# Patient Record
Sex: Female | Born: 1945 | Race: Black or African American | Hispanic: No | Smoking: Current every day smoker
Health system: Southern US, Community
[De-identification: ages and names within clinical notes are randomized; demographics above are authoritative.]

## PROBLEM LIST (undated history)

## (undated) DIAGNOSIS — M1990 Unspecified osteoarthritis, unspecified site: Secondary | ICD-10-CM

## (undated) DIAGNOSIS — M5136 Other intervertebral disc degeneration, lumbar region: Secondary | ICD-10-CM

## (undated) DIAGNOSIS — M51369 Other intervertebral disc degeneration, lumbar region without mention of lumbar back pain or lower extremity pain: Secondary | ICD-10-CM

## (undated) DIAGNOSIS — M8580 Other specified disorders of bone density and structure, unspecified site: Secondary | ICD-10-CM

## (undated) DIAGNOSIS — M869 Osteomyelitis, unspecified: Secondary | ICD-10-CM

## (undated) DIAGNOSIS — K219 Gastro-esophageal reflux disease without esophagitis: Secondary | ICD-10-CM

## (undated) DIAGNOSIS — E7800 Pure hypercholesterolemia, unspecified: Secondary | ICD-10-CM

## (undated) DIAGNOSIS — I10 Essential (primary) hypertension: Secondary | ICD-10-CM

## (undated) DIAGNOSIS — I639 Cerebral infarction, unspecified: Secondary | ICD-10-CM

## (undated) DIAGNOSIS — R7302 Impaired glucose tolerance (oral): Secondary | ICD-10-CM

## (undated) HISTORY — PX: COLONOSCOPY WITH ESOPHAGOGASTRODUODENOSCOPY (EGD): SHX5779

## (undated) HISTORY — PX: OTHER SURGICAL HISTORY: SHX169

## (undated) HISTORY — PX: TUBAL LIGATION: SHX77

## (undated) HISTORY — PX: APPENDECTOMY: SHX54

---

## 2005-05-02 ENCOUNTER — Ambulatory Visit: Payer: Self-pay | Admitting: Internal Medicine

## 2005-05-13 ENCOUNTER — Ambulatory Visit: Payer: Self-pay | Admitting: Internal Medicine

## 2005-11-18 ENCOUNTER — Ambulatory Visit: Payer: Self-pay | Admitting: Internal Medicine

## 2006-05-01 ENCOUNTER — Ambulatory Visit: Payer: Self-pay | Admitting: Internal Medicine

## 2006-07-14 ENCOUNTER — Ambulatory Visit: Payer: Self-pay | Admitting: Unknown Physician Specialty

## 2007-06-18 ENCOUNTER — Ambulatory Visit: Payer: Self-pay | Admitting: Nurse Practitioner

## 2008-06-23 ENCOUNTER — Ambulatory Visit: Payer: Self-pay | Admitting: Internal Medicine

## 2010-01-25 ENCOUNTER — Ambulatory Visit: Payer: Self-pay | Admitting: Internal Medicine

## 2012-02-14 ENCOUNTER — Ambulatory Visit: Payer: Self-pay | Admitting: Internal Medicine

## 2014-03-04 ENCOUNTER — Ambulatory Visit: Payer: Self-pay | Admitting: Internal Medicine

## 2016-02-02 ENCOUNTER — Other Ambulatory Visit: Payer: Self-pay | Admitting: Internal Medicine

## 2016-02-02 DIAGNOSIS — Z1231 Encounter for screening mammogram for malignant neoplasm of breast: Secondary | ICD-10-CM

## 2016-02-04 ENCOUNTER — Ambulatory Visit
Admission: RE | Admit: 2016-02-04 | Discharge: 2016-02-04 | Disposition: A | Discharge status: Home / Self Care | Payer: Medicare Other | Source: Ambulatory Visit | Attending: Internal Medicine | Admitting: Internal Medicine

## 2016-02-04 DIAGNOSIS — Z1231 Encounter for screening mammogram for malignant neoplasm of breast: Secondary | ICD-10-CM

## 2016-11-10 ENCOUNTER — Encounter: Payer: Self-pay | Admitting: *Deleted

## 2016-11-11 ENCOUNTER — Encounter: Payer: Self-pay | Admitting: Anesthesiology

## 2016-11-11 ENCOUNTER — Ambulatory Visit: Payer: Medicare Other | Admitting: Anesthesiology

## 2016-11-11 ENCOUNTER — Ambulatory Visit
Admission: RE | Admit: 2016-11-11 | Discharge: 2016-11-11 | Disposition: A | Discharge status: Home / Self Care | Payer: Medicare Other | Source: Ambulatory Visit | Attending: Unknown Physician Specialty | Admitting: Unknown Physician Specialty

## 2016-11-11 ENCOUNTER — Encounter
Admission: RE | Disposition: A | Discharge status: Home / Self Care | Payer: Self-pay | Source: Ambulatory Visit | Attending: Unknown Physician Specialty

## 2016-11-11 DIAGNOSIS — E669 Obesity, unspecified: Secondary | ICD-10-CM | POA: Diagnosis not present

## 2016-11-11 DIAGNOSIS — M1990 Unspecified osteoarthritis, unspecified site: Secondary | ICD-10-CM | POA: Diagnosis not present

## 2016-11-11 DIAGNOSIS — K640 First degree hemorrhoids: Secondary | ICD-10-CM | POA: Insufficient documentation

## 2016-11-11 DIAGNOSIS — K5730 Diverticulosis of large intestine without perforation or abscess without bleeding: Secondary | ICD-10-CM | POA: Insufficient documentation

## 2016-11-11 DIAGNOSIS — E7800 Pure hypercholesterolemia, unspecified: Secondary | ICD-10-CM | POA: Insufficient documentation

## 2016-11-11 DIAGNOSIS — K219 Gastro-esophageal reflux disease without esophagitis: Secondary | ICD-10-CM | POA: Insufficient documentation

## 2016-11-11 DIAGNOSIS — M8580 Other specified disorders of bone density and structure, unspecified site: Secondary | ICD-10-CM | POA: Insufficient documentation

## 2016-11-11 DIAGNOSIS — F17210 Nicotine dependence, cigarettes, uncomplicated: Secondary | ICD-10-CM | POA: Insufficient documentation

## 2016-11-11 DIAGNOSIS — I10 Essential (primary) hypertension: Secondary | ICD-10-CM | POA: Diagnosis not present

## 2016-11-11 DIAGNOSIS — Z8371 Family history of colonic polyps: Secondary | ICD-10-CM | POA: Insufficient documentation

## 2016-11-11 DIAGNOSIS — Z6841 Body Mass Index (BMI) 40.0 and over, adult: Secondary | ICD-10-CM | POA: Insufficient documentation

## 2016-11-11 DIAGNOSIS — Z7982 Long term (current) use of aspirin: Secondary | ICD-10-CM | POA: Insufficient documentation

## 2016-11-11 DIAGNOSIS — Z79899 Other long term (current) drug therapy: Secondary | ICD-10-CM | POA: Diagnosis not present

## 2016-11-11 DIAGNOSIS — Z1211 Encounter for screening for malignant neoplasm of colon: Secondary | ICD-10-CM | POA: Diagnosis present

## 2016-11-11 DIAGNOSIS — M5136 Other intervertebral disc degeneration, lumbar region: Secondary | ICD-10-CM | POA: Insufficient documentation

## 2016-11-11 HISTORY — PX: COLONOSCOPY WITH PROPOFOL: SHX5780

## 2016-11-11 HISTORY — DX: Impaired glucose tolerance (oral): R73.02

## 2016-11-11 HISTORY — DX: Other intervertebral disc degeneration, lumbar region without mention of lumbar back pain or lower extremity pain: M51.369

## 2016-11-11 HISTORY — DX: Essential (primary) hypertension: I10

## 2016-11-11 HISTORY — DX: Other specified disorders of bone density and structure, unspecified site: M85.80

## 2016-11-11 HISTORY — DX: Gastro-esophageal reflux disease without esophagitis: K21.9

## 2016-11-11 HISTORY — DX: Other intervertebral disc degeneration, lumbar region: M51.36

## 2016-11-11 HISTORY — DX: Pure hypercholesterolemia, unspecified: E78.00

## 2016-11-11 SURGERY — COLONOSCOPY WITH PROPOFOL
Anesthesia: General

## 2016-11-11 MED ORDER — MIDAZOLAM HCL 2 MG/2ML IJ SOLN
INTRAMUSCULAR | Status: DC | PRN
  Administered 2016-11-11: 1 mg via INTRAVENOUS

## 2016-11-11 MED ORDER — FENTANYL CITRATE (PF) 100 MCG/2ML IJ SOLN
INTRAMUSCULAR | Status: DC | PRN
Start: 2016-11-11 — End: 2016-11-11
  Administered 2016-11-11: 50 ug via INTRAVENOUS

## 2016-11-11 MED ORDER — LIDOCAINE HCL (PF) 1 % IJ SOLN
INTRAMUSCULAR | Status: AC
  Administered 2016-11-11: 0.3 mL
  Filled 2016-11-11: qty 2

## 2016-11-11 MED ORDER — PROPOFOL 500 MG/50ML IV EMUL
INTRAVENOUS | Status: DC | PRN
  Administered 2016-11-11: 120 ug/kg/min via INTRAVENOUS

## 2016-11-11 MED ORDER — SODIUM CHLORIDE 0.9 % IV SOLN
INTRAVENOUS | Status: DC
  Administered 2016-11-11: 1000 mL via INTRAVENOUS

## 2016-11-11 MED ORDER — SODIUM CHLORIDE 0.9 % IV SOLN
INTRAVENOUS | Status: DC | PRN
  Administered 2016-11-11: 11:00:00 via INTRAVENOUS

## 2016-11-11 NOTE — Anesthesia Postprocedure Evaluation (Signed)
Anesthesia Post Note  Patient: Annette Wells  Procedure(s) Performed: Procedure(s) (LRB): COLONOSCOPY WITH PROPOFOL (N/A)  Patient location during evaluation: Endoscopy Anesthesia Type: General Level of consciousness: awake and alert and oriented Pain management: pain level controlled Vital Signs Assessment: post-procedure vital signs reviewed and stable Respiratory status: spontaneous breathing, nonlabored ventilation and respiratory function stable Cardiovascular status: blood pressure returned to baseline and stable Postop Assessment: no signs of nausea or vomiting Anesthetic complications: no    Last Vitals:  Vitals:   11/11/16 1157 11/11/16 1207  BP: 109/76 127/79  Pulse: 63 62  Resp: (!) 22 18  Temp:      Last Pain:  Vitals:   11/11/16 1147  TempSrc: Tympanic                 [name]

## 2016-11-11 NOTE — H&P (Signed)
   Primary Care Physician:  Mickey FarberHIES, DAVID, MD Primary Gastroenterologist:  Dr. Mechele CollinElliott  Pre-Procedure History & Physical: HPI:  Annette Wells is a 70 y.o. female is here for an colonoscopy.   Past Medical History:  Diagnosis Date  . DDD (degenerative disc disease), lumbar   . GERD (gastroesophageal reflux disease)   . Glucose intolerance (impaired glucose tolerance)   . High cholesterol   . Hypertension   . Osteopenia     Past Surgical History:  Procedure Laterality Date  . APPENDECTOMY    . COLONOSCOPY WITH ESOPHAGOGASTRODUODENOSCOPY (EGD)    . mandibular osteomyelitis    . TUBAL LIGATION      Prior to Admission medications   Medication Sig Start Date End Date Taking? Authorizing Provider  aspirin 81 MG chewable tablet Chew by mouth daily.   Yes Historical Provider, MD  DilTIAZem HCl Coated Beads (CARTIA XT PO) Take 300 mg by mouth daily.   Yes Historical Provider, MD  losartan-hydrochlorothiazide (HYZAAR) 100-25 MG tablet Take 1 tablet by mouth daily.   Yes Historical Provider, MD    Allergies as of 10/11/2016  . (Not on File)    History reviewed. No pertinent family history.  Social History   Social History  . Marital status: Married    Spouse name: N/A  . Number of children: N/A  . Years of education: N/A   Occupational History  . Not on file.   Social History Main Topics  . Smoking status: Current Every Day Smoker    Packs/day: 0.50    Years: 15.00    Types: Cigarettes  . Smokeless tobacco: Not on file     Comment: future plan  . Alcohol use Not on file  . Drug use: Unknown  . Sexual activity: Not on file   Other Topics Concern  . Not on file   Social History Narrative  . No narrative on file    Review of Systems: See HPI, otherwise negative ROS  Physical Exam: BP (!) 156/72   Pulse 85   Temp (!) 96.8 F (36 C) (Oral)   Resp 20   Ht 5\' 1"  (1.549 m)   Wt 105.2 kg (232 lb)   SpO2 93%   BMI 43.84 kg/m  General:   Alert,  pleasant and  cooperative in NAD Head:  Normocephalic and atraumatic. Neck:  Supple; no masses or thyromegaly. Lungs:  Clear throughout to auscultation.    Heart:  Regular rate and rhythm. Abdomen:  Soft, nontender and nondistended. Normal bowel sounds, without guarding, and without rebound.   Neurologic:  Alert and  oriented x4;  grossly normal neurologically.  Impression/Plan: Annette Wells is here for an colonoscopy to be performed for FH colon polyps  Risks, benefits, limitations, and alternatives regarding  colonoscopy have been reviewed with the patient.  Questions have been answered.  All parties agreeable.   Lynnae PrudeELLIOTT, Tytionna Cloyd, MD  11/11/2016, 11:22 AM

## 2016-11-11 NOTE — Transfer of Care (Signed)
Immediate Anesthesia Transfer of Care Note  Patient: Annette Wells  Procedure(s) Performed: Procedure(s): COLONOSCOPY WITH PROPOFOL (N/A)  Patient Location: PACU  Anesthesia Type:General  Level of Consciousness: awake and sedated  Airway & Oxygen Therapy: Patient Spontanous Breathing and Patient connected to nasal cannula oxygen  Post-op Assessment: Report given to RN and Post -op Vital signs reviewed and stable  Post vital signs: Reviewed and stable  Last Vitals:  Vitals:   11/11/16 1101  BP: (!) 156/72  Pulse: 85  Resp: 20  Temp: (!) 36 C    Last Pain:  Vitals:   11/11/16 1101  TempSrc: Oral         Complications: No apparent anesthesia complications

## 2016-11-11 NOTE — Anesthesia Preprocedure Evaluation (Signed)
Anesthesia Evaluation  Patient identified by MRN, date of birth, ID band Patient awake    Reviewed: Allergy & Precautions, NPO status , Patient's Chart, lab work & pertinent test results  History of Anesthesia Complications Negative for: history of anesthetic complications  Airway Mallampati: II  TM Distance: >3 FB Neck ROM: Full    Dental  (+) Partial Lower, Partial Upper   Pulmonary neg pulmonary ROS, neg sleep apnea, neg COPD,    breath sounds clear to auscultation- rhonchi (-) wheezing      Cardiovascular hypertension, Pt. on medications (-) CAD, (-) Past MI and (-) Cardiac Stents  Rhythm:Regular Rate:Normal - Systolic murmurs and - Diastolic murmurs    Neuro/Psych negative neurological ROS  negative psych ROS   GI/Hepatic Neg liver ROS, GERD  ,  Endo/Other  negative endocrine ROSneg diabetes  Renal/GU negative Renal ROS     Musculoskeletal  (+) Arthritis ,   Abdominal (+) + obese,   Peds  Hematology negative hematology ROS (+)   Anesthesia Other Findings Past Medical History: No date: DDD (degenerative disc disease), lumbar No date: GERD (gastroesophageal reflux disease) No date: Glucose intolerance (impaired glucose toleranc* No date: High cholesterol No date: Hypertension No date: Osteopenia   Reproductive/Obstetrics                             Anesthesia Physical Anesthesia Plan  ASA: III  Anesthesia Plan: General   Post-op Pain Management:    Induction: Intravenous  Airway Management Planned: Natural Airway  Additional Equipment:   Intra-op Plan:   Post-operative Plan:   Informed Consent: I have reviewed the patients History and Physical, chart, labs and discussed the procedure including the risks, benefits and alternatives for the proposed anesthesia with the patient or authorized representative who has indicated his/her understanding and acceptance.   Dental  advisory given  Plan Discussed with: CRNA and Anesthesiologist  Anesthesia Plan Comments:         Anesthesia Quick Evaluation

## 2016-11-11 NOTE — Anesthesia Procedure Notes (Signed)
Performed by: COOK-MARTIN, Chistine Dematteo Pre-anesthesia Checklist: Patient identified, Emergency Drugs available, Suction available, Patient being monitored and Timeout performed Patient Re-evaluated:Patient Re-evaluated prior to inductionOxygen Delivery Method: Nasal cannula Preoxygenation: Pre-oxygenation with 100% oxygen Intubation Type: IV induction Ventilation: Oral airway inserted - appropriate to patient size Placement Confirmation: positive ETCO2 and CO2 detector     

## 2016-11-11 NOTE — Op Note (Signed)
[facility] Gastroenterology Patient Name: Annette Wells Procedure Date: 11/11/2016 11:25 AM MRN: [id number] Account #: [id number] Date of Birth: 1945/12/31 Admit Type: Outpatient Age: 70 [facility]OM 4 Gender: Female Note Status: Finalized Procedure:            Colonoscopy Indications:          Colon cancer screening in patient at increased risk:                        Family history of 1st-degree relative with colon polyps Providers:            Scot Junobert T. Elliott, MD Referring MD:         Neomia Dearavid N. Harrington Challengerhies, MD (Referring MD) Medicines:            Propofol per Anesthesia Complications:        No immediate complications. Procedure:            Pre-Anesthesia Assessment:                       - After reviewing the risks and benefits, the patient                        was deemed in satisfactory condition to undergo the                        procedure.                       After obtaining informed consent, the colonoscope was                        passed under direct vision. Throughout the procedure,                        the patient's blood pressure, pulse, and oxygen                        saturations were monitored continuously. The                        Colonoscope was introduced through the anus and                        advanced to the the cecum, identified by appendiceal                        orifice and ileocecal valve. The colonoscopy was                        performed without difficulty. The patient tolerated the                        procedure well. The quality of the bowel preparation                        was good. Findings:      Multiple small-mouthed diverticula were found in the sigmoid colon,       descending colon, transverse colon and ascending colon.      Internal hemorrhoids were found during endoscopy. The hemorrhoids were  small and Grade I (internal hemorrhoids that do not prolapse).      The exam was otherwise without  abnormality. Impression:           - Diverticulosis in the sigmoid colon, in the                        descending colon, in the transverse colon and in the                        ascending colon.                       - Internal hemorrhoids.                       - The examination was otherwise normal.                       - No specimens collected. Recommendation:       - Repeat colonoscopy in 5 years for screening purposes. Scot Junobert T Elliott, MD 11/11/2016 11:44:44 AM This report has been signed electronically. Number of Addenda: 0 Note Initiated On: 11/11/2016 11:25 AM Scope Withdrawal Time: 0 hours 9 minutes 11 seconds  Total Procedure Duration: 0 hours 12 minutes 52 seconds       [facility]

## 2016-11-14 ENCOUNTER — Encounter: Payer: Self-pay | Admitting: Unknown Physician Specialty

## 2016-12-05 DIAGNOSIS — I639 Cerebral infarction, unspecified: Secondary | ICD-10-CM

## 2016-12-05 HISTORY — DX: Cerebral infarction, unspecified: I63.9

## 2017-08-23 ENCOUNTER — Emergency Department: Payer: Medicare Other

## 2017-08-23 ENCOUNTER — Observation Stay
Admission: EM | Admit: 2017-08-23 | Discharge: 2017-08-24 | Disposition: A | Discharge status: Home / Self Care | Payer: Medicare Other | Attending: Internal Medicine | Admitting: Internal Medicine

## 2017-08-23 ENCOUNTER — Observation Stay: Payer: Medicare Other

## 2017-08-23 ENCOUNTER — Encounter: Payer: Self-pay | Admitting: Emergency Medicine

## 2017-08-23 DIAGNOSIS — M8580 Other specified disorders of bone density and structure, unspecified site: Secondary | ICD-10-CM | POA: Diagnosis not present

## 2017-08-23 DIAGNOSIS — G459 Transient cerebral ischemic attack, unspecified: Secondary | ICD-10-CM | POA: Diagnosis not present

## 2017-08-23 DIAGNOSIS — I371 Nonrheumatic pulmonary valve insufficiency: Secondary | ICD-10-CM | POA: Insufficient documentation

## 2017-08-23 DIAGNOSIS — F17210 Nicotine dependence, cigarettes, uncomplicated: Secondary | ICD-10-CM | POA: Insufficient documentation

## 2017-08-23 DIAGNOSIS — Z7982 Long term (current) use of aspirin: Secondary | ICD-10-CM | POA: Insufficient documentation

## 2017-08-23 DIAGNOSIS — R471 Dysarthria and anarthria: Secondary | ICD-10-CM | POA: Diagnosis present

## 2017-08-23 DIAGNOSIS — Z79899 Other long term (current) drug therapy: Secondary | ICD-10-CM | POA: Diagnosis not present

## 2017-08-23 DIAGNOSIS — E7800 Pure hypercholesterolemia, unspecified: Secondary | ICD-10-CM | POA: Diagnosis not present

## 2017-08-23 DIAGNOSIS — M5136 Other intervertebral disc degeneration, lumbar region: Secondary | ICD-10-CM | POA: Diagnosis not present

## 2017-08-23 DIAGNOSIS — K219 Gastro-esophageal reflux disease without esophagitis: Secondary | ICD-10-CM | POA: Insufficient documentation

## 2017-08-23 DIAGNOSIS — N390 Urinary tract infection, site not specified: Secondary | ICD-10-CM | POA: Insufficient documentation

## 2017-08-23 DIAGNOSIS — G458 Other transient cerebral ischemic attacks and related syndromes: Secondary | ICD-10-CM

## 2017-08-23 DIAGNOSIS — I119 Hypertensive heart disease without heart failure: Secondary | ICD-10-CM | POA: Diagnosis not present

## 2017-08-23 DIAGNOSIS — E860 Dehydration: Secondary | ICD-10-CM | POA: Insufficient documentation

## 2017-08-23 DIAGNOSIS — R531 Weakness: Secondary | ICD-10-CM

## 2017-08-23 LAB — ETHANOL: Alcohol, Ethyl (B): <5 mg/dL (ref <5)

## 2017-08-23 LAB — URINALYSIS, COMPLETE (UACMP) WITH MICROSCOPIC
BACTERIA UA: NONE SEEN
BILIRUBIN URINE: NEGATIVE
Glucose, UA: NEGATIVE mg/dL
HGB URINE DIPSTICK: NEGATIVE
KETONES UR: NEGATIVE mg/dL
NITRITE: NEGATIVE
Protein, ur: 30 mg/dL — AB
SPECIFIC GRAVITY, URINE: 1.024 (ref 1.005–1.030)
pH: 5 (ref 5.0–8.0)

## 2017-08-23 LAB — CBC WITH DIFFERENTIAL/PLATELET
BASOS PCT: 1 %
Basophils Absolute: 0 10*3/uL (ref 0–0.1)
Eosinophils Absolute: 0.1 10*3/uL (ref 0–0.7)
Eosinophils Relative: 3 %
HEMATOCRIT: 42.8 % (ref 35.0–47.0)
HEMOGLOBIN: 14.6 g/dL (ref 12.0–16.0)
LYMPHS PCT: 25 %
Lymphs Abs: 1.3 10*3/uL (ref 1.0–3.6)
MCH: 30.7 pg (ref 26.0–34.0)
MCHC: 34.1 g/dL (ref 32.0–36.0)
MCV: 89.8 fL (ref 80.0–100.0)
MONO ABS: 0.6 10*3/uL (ref 0.2–0.9)
Monocytes Relative: 11 %
NEUTROS ABS: 3.2 10*3/uL (ref 1.4–6.5)
NEUTROS PCT: 60 %
Platelets: 178 10*3/uL (ref 150–440)
RBC: 4.76 MIL/uL (ref 3.80–5.20)
RDW: 14.2 % (ref 11.5–14.5)
WBC: 5.3 10*3/uL (ref 3.6–11.0)

## 2017-08-23 LAB — URINE DRUG SCREEN, QUALITATIVE (ARMC ONLY)
AMPHETAMINES, UR SCREEN: NOT DETECTED
BENZODIAZEPINE, UR SCRN: NOT DETECTED
Barbiturates, Ur Screen: NOT DETECTED
Cannabinoid 50 Ng, Ur ~~LOC~~: POSITIVE — AB
Cocaine Metabolite,Ur ~~LOC~~: NOT DETECTED
MDMA (Ecstasy)Ur Screen: NOT DETECTED
METHADONE SCREEN, URINE: NOT DETECTED
OPIATE, UR SCREEN: NOT DETECTED
Phencyclidine (PCP) Ur S: NOT DETECTED
TRICYCLIC, UR SCREEN: NOT DETECTED

## 2017-08-23 LAB — COMPREHENSIVE METABOLIC PANEL
ALK PHOS: 84 U/L (ref 38–126)
ALT: 28 U/L (ref 14–54)
ANION GAP: 7 (ref 5–15)
AST: 32 U/L (ref 15–41)
Albumin: 3.5 g/dL (ref 3.5–5.0)
BUN: 23 mg/dL — ABNORMAL HIGH (ref 6–20)
CO2: 28 mmol/L (ref 22–32)
Calcium: 10 mg/dL (ref 8.9–10.3)
Chloride: 106 mmol/L (ref 101–111)
Creatinine, Ser: 0.91 mg/dL (ref 0.44–1.00)
GFR calc non Af Amer: >60 mL/min (ref >60)
GLUCOSE: 150 mg/dL — AB (ref 65–99)
POTASSIUM: 3.8 mmol/L (ref 3.5–5.1)
Sodium: 141 mmol/L (ref 135–145)
TOTAL PROTEIN: 7.7 g/dL (ref 6.5–8.1)
Total Bilirubin: 0.5 mg/dL (ref 0.3–1.2)

## 2017-08-23 LAB — BLOOD GAS, VENOUS
Acid-Base Excess: 3.1 mmol/L — ABNORMAL HIGH (ref 0.0–2.0)
BICARBONATE: 29.9 mmol/L — AB (ref 20.0–28.0)
O2 Saturation: 67.9 %
PATIENT TEMPERATURE: 37
PH VEN: 7.36 (ref 7.250–7.430)
pCO2, Ven: 53 mmHg (ref 44.0–60.0)
pO2, Ven: 37 mmHg (ref 32.0–45.0)

## 2017-08-23 LAB — PROTIME-INR
INR: 0.97
Prothrombin Time: 12.8 seconds (ref 11.4–15.2)

## 2017-08-23 LAB — TROPONIN I: Troponin I: <0.03 ng/mL (ref <0.03)

## 2017-08-23 LAB — AMMONIA: Ammonia: 14 umol/L (ref 9–35)

## 2017-08-23 LAB — GLUCOSE, CAPILLARY: GLUCOSE-CAPILLARY: 129 mg/dL — AB (ref 65–99)

## 2017-08-23 LAB — APTT: APTT: 28 s (ref 24–36)

## 2017-08-23 MED ORDER — DEXTROSE 5 % IV SOLN
1.0000 g | INTRAVENOUS | Status: DC
  Administered 2017-08-24: 10:00:00 1 g via INTRAVENOUS
  Filled 2017-08-23: qty 10

## 2017-08-23 MED ORDER — SODIUM CHLORIDE 0.9 % IV SOLN
INTRAVENOUS | Status: DC
  Administered 2017-08-23 – 2017-08-24 (×2): via INTRAVENOUS

## 2017-08-23 MED ORDER — SENNOSIDES-DOCUSATE SODIUM 8.6-50 MG PO TABS: 1.0000 | ORAL_TABLET | Freq: Every evening | ORAL | Status: DC | PRN

## 2017-08-23 MED ORDER — ACETAMINOPHEN 160 MG/5ML PO SOLN
650.0000 mg | ORAL | Status: DC | PRN
  Filled 2017-08-23: qty 20.3

## 2017-08-23 MED ORDER — ASPIRIN 81 MG PO CHEW
324.0000 mg | CHEWABLE_TABLET | Freq: Once | ORAL | Status: AC
  Administered 2017-08-23: 324 mg via ORAL
  Filled 2017-08-23: qty 4

## 2017-08-23 MED ORDER — ENOXAPARIN SODIUM 40 MG/0.4ML ~~LOC~~ SOLN
40.0000 mg | SUBCUTANEOUS | Status: DC
  Administered 2017-08-23: 40 mg via SUBCUTANEOUS
  Filled 2017-08-23: qty 0.4

## 2017-08-23 MED ORDER — ACETAMINOPHEN 325 MG PO TABS: 650.0000 mg | ORAL_TABLET | ORAL | Status: DC | PRN

## 2017-08-23 MED ORDER — STROKE: EARLY STAGES OF RECOVERY BOOK
Freq: Once | Status: AC
  Administered 2017-08-23: 16:00:00

## 2017-08-23 MED ORDER — CEFTRIAXONE SODIUM IN DEXTROSE 20 MG/ML IV SOLN
1.0000 g | Freq: Once | INTRAVENOUS | Status: AC
  Administered 2017-08-23: 1 g via INTRAVENOUS
  Filled 2017-08-23: qty 50

## 2017-08-23 MED ORDER — PNEUMOCOCCAL VAC POLYVALENT 25 MCG/0.5ML IJ INJ: 0.5000 mL | INJECTION | INTRAMUSCULAR | Status: DC

## 2017-08-23 MED ORDER — DILTIAZEM HCL ER BEADS 240 MG PO CP24
360.0000 mg | ORAL_CAPSULE | Freq: Every day | ORAL | Status: DC
  Filled 2017-08-23: qty 1

## 2017-08-23 MED ORDER — ATORVASTATIN CALCIUM 20 MG PO TABS
40.0000 mg | ORAL_TABLET | Freq: Every day | ORAL | Status: DC
  Administered 2017-08-23: 18:00:00 40 mg via ORAL
  Filled 2017-08-23: qty 2

## 2017-08-23 MED ORDER — LOSARTAN POTASSIUM 50 MG PO TABS
100.0000 mg | ORAL_TABLET | Freq: Every day | ORAL | Status: DC
  Administered 2017-08-23 – 2017-08-24 (×2): 100 mg via ORAL
  Filled 2017-08-23 (×2): qty 2

## 2017-08-23 MED ORDER — INFLUENZA VAC SPLIT HIGH-DOSE 0.5 ML IM SUSY
0.5000 mL | PREFILLED_SYRINGE | INTRAMUSCULAR | Status: DC
  Filled 2017-08-23: qty 0.5

## 2017-08-23 MED ORDER — ASPIRIN 325 MG PO TABS
325.0000 mg | ORAL_TABLET | Freq: Every day | ORAL | Status: DC
  Administered 2017-08-24: 10:00:00 325 mg via ORAL
  Filled 2017-08-23: qty 1

## 2017-08-23 MED ORDER — ACETAMINOPHEN 650 MG RE SUPP: 650.0000 mg | RECTAL | Status: DC | PRN

## 2017-08-23 MED ORDER — HYDROCHLOROTHIAZIDE 25 MG PO TABS: 25.0000 mg | ORAL_TABLET | Freq: Every day | ORAL | Status: DC

## 2017-08-23 MED ORDER — NICOTINE 14 MG/24HR TD PT24
14.0000 mg | MEDICATED_PATCH | Freq: Every day | TRANSDERMAL | Status: DC
  Administered 2017-08-23 – 2017-08-24 (×2): 14 mg via TRANSDERMAL
  Filled 2017-08-23 (×2): qty 1

## 2017-08-23 MED ORDER — LOSARTAN POTASSIUM-HCTZ 100-25 MG PO TABS: 1.0000 | ORAL_TABLET | Freq: Every day | ORAL | Status: DC

## 2017-08-23 NOTE — ED Notes (Signed)
Tele Neuro on call at this time

## 2017-08-23 NOTE — ED Notes (Signed)
Family at bedside states pt is usually ambulatory with assistance, speech clear and able to use upper extremeties. LKW was last night aprox 1am when going to bed and woke up with weakness and slurred speech around 7am today.

## 2017-08-23 NOTE — ED Triage Notes (Signed)
Pt to ED via EMS from home with c/o weakness and slurred speech, per EMS LKW was last night. Pt A&O, speech gargled, MD at bedside.

## 2017-08-23 NOTE — Progress Notes (Signed)
[facility] responded to consult for Advance Directive education and information. Patient was currently unavailable. CH will try to see patient at a later time. Contact when patient is ready.   08/23/17 1400  Clinical Encounter Type  Visited With Patient not available  Visit Type Initial;Spiritual support;Other (Comment)  Referral From Nurse

## 2017-08-23 NOTE — H&P (Addendum)
[employer] - [place] at [facility]   PATIENT NAME: Annette Wells    MR#:  [id number]  DATE OF BIRTH:  [birth year]  DATE OF ADMISSION:  08/23/2017  PRIMARY CARE PHYSICIAN: Mickey Farber, MD   REQUESTING/REFERRING PHYSICIAN: Sharman Cheek, MD  CHIEF COMPLAINT:   Chief Complaint  Patient presents with  . Weakness   Slurred speech and confusion today. HISTORY OF PRESENT ILLNESS:  Annette Wells  is a 71 y.o. female with a known history of Hypertension, hyperlipidemia and degenerative disc disease. The patient presents in the ED with the above chief complaints. The patient was noticed slurred speech and confusion at about 7:00 this morning. She complains of dizziness and generalized weakness. She also complains of dysuria and urinary frequency for 2 weeks. Source patient confusion improved in the ED. A CAT scan of the head didn't show any abnormality. She was given aspirin in the ED.  PAST MEDICAL HISTORY:   Past Medical History:  Diagnosis Date  . DDD (degenerative disc disease), lumbar   . GERD (gastroesophageal reflux disease)   . Glucose intolerance (impaired glucose tolerance)   . High cholesterol   . Hypertension   . Osteopenia     PAST SURGICAL HISTORY:   Past Surgical History:  Procedure Laterality Date  . APPENDECTOMY    . COLONOSCOPY WITH ESOPHAGOGASTRODUODENOSCOPY (EGD)    . COLONOSCOPY WITH PROPOFOL N/A 11/11/2016   Procedure: COLONOSCOPY WITH PROPOFOL;  Surgeon: Scot Jun, MD;  Location: [facility] ENDOSCOPY;  Service: Endoscopy;  Laterality: N/A;  . mandibular osteomyelitis    . TUBAL LIGATION      SOCIAL HISTORY:   Social History  Substance Use Topics  . Smoking status: Current Every Day Smoker    Packs/day: 0.50    Years: 15.00    Types: Cigarettes  . Smokeless tobacco: Never Used     Comment: future plan  . Alcohol use No    FAMILY HISTORY:   Family History  Problem Relation Age of Onset  . Diabetes Mother   . Hypertension  Mother   . Diabetes Father   . Cirrhosis Father   . Leukemia Father   . Diabetes Sister   . Stroke Sister   . Chronic Renal Failure Sister     DRUG ALLERGIES:  No Known Allergies  REVIEW OF SYSTEMS:   Review of Systems  Constitutional: Negative for chills, fever and malaise/fatigue.  HENT: Negative for sore throat.   Eyes: Negative for blurred vision and double vision.  Respiratory: Negative for cough, hemoptysis, shortness of breath, wheezing and stridor.   Cardiovascular: Negative for chest pain, palpitations, orthopnea and leg swelling.  Gastrointestinal: Negative for abdominal pain, blood in stool, diarrhea, melena, nausea and vomiting.  Genitourinary: Negative for dysuria, flank pain and hematuria.  Musculoskeletal: Negative for back pain and joint pain.  Skin: Negative for rash.  Neurological: Positive for dizziness and speech change. Negative for sensory change, focal weakness, seizures, loss of consciousness, weakness and headaches.  Endo/Heme/Allergies: Negative for polydipsia.  Psychiatric/Behavioral: Negative for depression. The patient is not nervous/anxious.     MEDICATIONS AT HOME:   Prior to Admission medications   Medication Sig Start Date End Date Taking? Authorizing Provider  aspirin 81 MG chewable tablet Chew 81 mg by mouth daily.    Yes [provider]  diltiazem (TIAZAC) 360 MG 24 hr capsule Take 360 mg by mouth daily. 08/14/17  Yes [provider]  losartan-hydrochlorothiazide (HYZAAR) 100-25 MG tablet Take 1 tablet by mouth  daily.   Yes [provider]  predniSONE (DELTASONE) 10 MG tablet Take 10-60 mg by mouth daily. 6,5,4,3,2,1 tablet tapering dose 08/22/17  Yes [provider]  tiZANidine (ZANAFLEX) 4 MG tablet Take 4 mg by mouth at bedtime as needed. 08/22/17  Yes [provider]      VITAL SIGNS:  Blood pressure (!) 131/50, pulse (!) 55, temperature 97.8 F (36.6 C), temperature source Oral, resp. rate  18, height  (1.549 m), weight 233 lb 6.4 oz (105.9 kg), SpO2 100 %.  PHYSICAL EXAMINATION:  Physical Exam  GENERAL:  71 y.o.-year-old patient lying in the bed with no acute distress.  EYES: Pupils equal, round, reactive to light and accommodation. No scleral icterus. Extraocular muscles intact.  HEENT: Head atraumatic, normocephalic. Oropharynx and nasopharynx clear.  NECK:  Supple, no jugular venous distention. No thyroid enlargement, no tenderness.  LUNGS: Normal breath sounds bilaterally, no wheezing, rales,rhonchi or crepitation. No use of accessory muscles of respiration.  CARDIOVASCULAR: S1, S2 normal. No murmurs, rubs, or gallops.  ABDOMEN: Soft, nontender, nondistended. Bowel sounds present. No organomegaly or mass.  EXTREMITIES: No pedal edema, cyanosis, or clubbing.  NEUROLOGIC: Cranial nerves II through XII are intact. Muscle strength 5/5 in all extremities except 4/5 in right leg. Sensation intact. Gait not checked.  PSYCHIATRIC: The patient is alert and oriented x 3.  SKIN: No obvious rash, lesion, or ulcer.   LABORATORY PANEL:   CBC  Recent Labs Lab 08/23/17 0823  WBC 5.3  HGB 14.6  HCT 42.8  PLT 178   ------------------------------------------------------------------------------------------------------------------  Chemistries   Recent Labs Lab 08/23/17 0823  NA 141  K 3.8  CL 106  CO2 28  GLUCOSE 150*  BUN 23*  CREATININE 0.91  CALCIUM 10.0  AST 32  ALT 28  ALKPHOS 84  BILITOT 0.5   ------------------------------------------------------------------------------------------------------------------  Cardiac Enzymes  Recent Labs Lab 08/23/17 0823  TROPONINI <0.03   ------------------------------------------------------------------------------------------------------------------  RADIOLOGY:  Dg Chest Portable 1 View  Result Date: 08/23/2017 CLINICAL DATA:  Generalize weakness, altered mental status EXAM: PORTABLE CHEST 1 VIEW COMPARISON:   None. FINDINGS: There is no focal parenchymal opacity. There is no pleural effusion or pneumothorax. There is cardiomegaly. The osseous structures are unremarkable. IMPRESSION: No acute cardiopulmonary disease.  Cardiomegaly. Electronically Signed   By: Elige Ko   On: 08/23/2017 09:52   Ct Head Code Stroke Wo Contrast  Result Date: 08/23/2017 CLINICAL DATA:  Code stroke. Awoke with weakness and slurred speech. EXAM: CT HEAD WITHOUT CONTRAST TECHNIQUE: Contiguous axial images were obtained from the base of the skull through the vertex without intravenous contrast. COMPARISON:  None. FINDINGS: Brain: No evidence of acute infarction, hemorrhage, hydrocephalus, extra-axial collection or mass lesion/mass effect. Vascular: No hyperdense vessel or unexpected calcification. Skull: Negative. Sinuses/Orbits: Frothy secretions in the left sphenoid sinus, incidental to the history. Other: These results were called by telephone at the time of interpretation on 08/23/2017 at 8:34 am to Dr. Sharman Cheek , who verbally acknowledged these results. ASPECTS [facility] Stroke Program Early CT Score) - Ganglionic level infarction (caudate, lentiform nuclei, internal capsule, insula, M1-M3 cortex): 7 - Supraganglionic infarction (M4-M6 cortex): 3 Total score (0-10 with 10 being normal): 10 IMPRESSION: No acute finding.  Aspects is 10. Electronically Signed   By: Marnee Spring M.D.   On: 08/23/2017 08:40      IMPRESSION AND PLAN:   TIA The patient will be placed for observation. Continue aspirin 325 mg by mouth daily, add Lipitor, neuro check,  MRI/MRA of the brain, carotid duplex and echocardiogram. PT evaluation.  UTI. Continue Rocephin IV and a follow-up urine culture.  Mild dehydration. Continue IV fluid support and follow-up BMP.  Hypertension. Continue hypertension medication.  Tobacco abuse. Smoking cessation was counseled for 5-6 minutes All the records are reviewed and case discussed with ED  provider. Management plans discussed with the patient, her husband, daughter and son, and they are in agreement.  CODE STATUS: Full code  TOTAL TIME TAKING CARE OF THIS PATIENT: 62 minutes.    Shaune Pollack M.D on 08/23/2017 at 1:06 PM  Between 7am to 6pm - Pager - [phone]  After 6pm go to [email or web address] - Scientist, research (life sciences) [facility]  Office  [phone]  CC: Primary care physician; Mickey Farber, MD   Note: This dictation was prepared with Dragon dictation along with smaller phrase technology. Any transcriptional errors that result from this process are unin

## 2017-08-23 NOTE — ED Provider Notes (Signed)
[facility] Emergency Department Provider Note  ____________________________________________  Time seen: Approximately 9:53 AM  I have reviewed the triage vital signs and the nursing notes.   HISTORY  Chief Complaint Weakness  Level 5 Caveat: Portions of the History and Physical were unable to be obtained due to altered mental status. Additional history obtained from family at bedside when they arrived  HPI Annette Wells is a 71 y.o. female comes to the ED via EMS due to generalized weakness and slurred speech that she noticed on waking up this morning. Patient was in her usual state of health when she went to bed at about 1:00 AM last night. She woke up just before 7:00, noted the symptoms. Called her family to the bedside who found her too weak to stand and leaning to her left, and assisted her and laying back down on the bed. Patient denies chest pain shortness of breath abdominal pain back pain or headache. No vision changes. No lateralizing weakness or paresthesia. Never had anything like this before.     Past Medical History:  Diagnosis Date  . DDD (degenerative disc disease), lumbar   . GERD (gastroesophageal reflux disease)   . Glucose intolerance (impaired glucose tolerance)   . High cholesterol   . Hypertension   . Osteopenia      There are no active problems to display for this patient.    Past Surgical History:  Procedure Laterality Date  . APPENDECTOMY    . COLONOSCOPY WITH ESOPHAGOGASTRODUODENOSCOPY (EGD)    . COLONOSCOPY WITH PROPOFOL N/A 11/11/2016   Procedure: COLONOSCOPY WITH PROPOFOL;  Surgeon: Scot Jun, MD;  Location: [facility] ENDOSCOPY;  Service: Endoscopy;  Laterality: N/A;  . mandibular osteomyelitis    . TUBAL LIGATION       Prior to Admission medications   Medication Sig Start Date End Date Taking? Authorizing Provider  aspirin 81 MG chewable tablet Chew by mouth daily.    [provider]  DilTIAZem HCl  Coated Beads (CARTIA XT PO) Take 300 mg by mouth daily.    [provider]  losartan-hydrochlorothiazide (HYZAAR) 100-25 MG tablet Take 1 tablet by mouth daily.    [provider]     Allergies Patient has no known allergies.   No family history on file.  Social History Social History  Substance Use Topics  . Smoking status: Current Every Day Smoker    Packs/day: 0.50    Years: 15.00    Types: Cigarettes  . Smokeless tobacco: Never Used     Comment: future plan  . Alcohol use No    Review of Systems  Constitutional:   No fever or chills.   Cardiovascular:   No chest pain or syncope. Respiratory:   No dyspnea or cough. Gastrointestinal:   Negative for abdominal pain, vomiting and diarrhea.  Musculoskeletal:  Chronic right-sided sciatica. No neck pain or stiffness All other systems reviewed and are negative except as documented above in ROS and HPI.  ____________________________________________   PHYSICAL EXAM:  VITAL SIGNS: ED Triage Vitals  Enc Vitals Group     BP 08/23/17 0804 (!) 151/65     Pulse Rate 08/23/17 0804 (!) 133     Resp 08/23/17 0804 16     Temp 08/23/17 0804 98 F (36.7 C)     Temp Source 08/23/17 0804 Oral     SpO2 08/23/17 0804 100 %     Weight 08/23/17 0805 235 lb (106.6 kg)     Height 08/23/17  0805  (1.676 m)     Head Circumference --      Peak Flow --      Pain Score 08/23/17 0802 0     Pain Loc --      Pain Edu? --      Excl. in GC? --     Vital signs reviewed, nursing assessments reviewed.   Constitutional:   Alert and oriented.Not in distress Eyes:   No scleral icterus.  EOMI. No nystagmus. No conjunctival pallor. PERRL. ENT   Head:   Normocephalic and atraumatic.   Nose:   No congestion/rhinnorhea.    Mouth/Throat:   Dry mucous membranes, no pharyngeal erythema. No peritonsillar mass.    Neck:   No meningismus. Full ROM Hematological/Lymphatic/Immunilogical:   No cervical  lymphadenopathy. Cardiovascular:   RRR. Symmetric bilateral radial and DP pulses.  No murmurs.  Respiratory:   Normal respiratory effort without tachypnea/retractions. Breath sounds are clear and equal bilaterally. No wheezes/rales/rhonchi. Gastrointestinal:   Soft and nontender. Non distended. There is no CVA tenderness.  No rebound, rigidity, or guarding. Genitourinary:   deferred Musculoskeletal:   Normal range of motion in all extremities. No joint effusions.  No lower extremity tenderness.  No edema. Neurologic:   Dysarthric speech, normal language. Symmetric strength, 5/5 in upper extremities, 4/5 in lower extremities.  Intact visual fields No limb ataxia Motor grossly intact. No facial asymmetry  NIHSS = 5   Skin:    Skin is warm, dry and intact. No rash noted.  No petechiae, purpura, or bullae.  ____________________________________________    LABS (pertinent positives/negatives) (all labs ordered are listed, but only abnormal results are displayed) Labs Reviewed  COMPREHENSIVE METABOLIC PANEL - Abnormal; Notable for the following:       Result Value   Glucose, Bld 150 (*)    BUN 23 (*)    All other components within normal limits  GLUCOSE, CAPILLARY - Abnormal; Notable for the following:    Glucose-Capillary 129 (*)    All other components within normal limits  BLOOD GAS, VENOUS - Abnormal; Notable for the following:    Bicarbonate 29.9 (*)    Acid-Base Excess 3.1 (*)    All other components within normal limits  PROTIME-INR  APTT  TROPONIN I  CBC WITH DIFFERENTIAL/PLATELET  ETHANOL  URINE DRUG SCREEN, QUALITATIVE ([facility] ONLY)  URINALYSIS, COMPLETE (UACMP) WITH MICROSCOPIC  AMMONIA   ____________________________________________   EKG  Interpreted by me  Date: 08/23/2017  Rate: 64  Rhythm: normal sinus rhythm  QRS Axis: normal  Intervals: normal  ST/T Wave abnormalities: normal  Conduction Disutrbances: none  Narrative Interpretation:  unremarkable      ____________________________________________    RADIOLOGY  Dg Chest Portable 1 View  Result Date: 08/23/2017 CLINICAL DATA:  Generalize weakness, altered mental status EXAM: PORTABLE CHEST 1 VIEW COMPARISON:  None. FINDINGS: There is no focal parenchymal opacity. There is no pleural effusion or pneumothorax. There is cardiomegaly. The osseous structures are unremarkable. IMPRESSION: No acute cardiopulmonary disease.  Cardiomegaly. Electronically Signed   By: Elige Ko   On: 08/23/2017 09:52   Ct Head Code Stroke Wo Contrast  Result Date: 08/23/2017 CLINICAL DATA:  Code stroke. Awoke with weakness and slurred speech. EXAM: CT HEAD WITHOUT CONTRAST TECHNIQUE: Contiguous axial images were obtained from the base of the skull through the vertex without intravenous contrast. COMPARISON:  None. FINDINGS: Brain: No evidence of acute infarction, hemorrhage, hydrocephalus, extra-axial collection or mass lesion/mass effect. Vascular: No hyperdense vessel or unexpected  calcification. Skull: Negative. Sinuses/Orbits: Frothy secretions in the left sphenoid sinus, incidental to the history. Other: These results were called by telephone at the time of interpretation on 08/23/2017 at 8:34 am to Dr. Sharman Cheek , who verbally acknowledged these results. ASPECTS [facility] Stroke Program Early CT Score) - Ganglionic level infarction (caudate, lentiform nuclei, internal capsule, insula, M1-M3 cortex): 7 - Supraganglionic infarction (M4-M6 cortex): 3 Total score (0-10 with 10 being normal): 10 IMPRESSION: No acute finding.  Aspects is 10. Electronically Signed   By: Marnee Spring M.D.   On: 08/23/2017 08:40    ____________________________________________   PROCEDURES Procedures  ____________________________________________   INITIAL IMPRESSION / ASSESSMENT AND PLAN / ED COURSE  Pertinent labs & imaging results that were available during my care of the patient were reviewed by me  and considered in my medical decision making (see chart for details).  Patient presents with acute neurologic symptoms, dysarthria. No lateralizing signs, negative for LVO screen. Code stroke initiated for further workup  Clinical Course as of Aug 23 1134  Wed Aug 23, 2017  1610 CT d/w radiology. Negative.   [PS]  [id number] D/w Neuro SOC, not a candidate for TPA.  Possible stroke, recommends hospitalization for futher workup  [PS]  0941 Pt's care, results so far d/w family. Pt reassessed, no change in exam  [PS]  0944 nl pCO2, Ven: 53 [PS]  0949 Erroneous. Peripheral pulse 60 on exam Pulse Rate: (!) 133 [PS]  [id number] W/u negative so far. Ready for dispo/hospitalization. Family unsure about whether they want patient admitted here at [facility] vs requesting transfer to [facility]. Waiting for patient's son to arrive to discuss further.  [PS]  1057 Pt now feeling much better. Clear speech, full strength. NIHSS 0. States she does not want to be transferred. Will d/w hospitalist.   [PS]  1057 ceftriaxone WBC, UA: TOO NUMEROUS TO COUNT [PS]    Clinical Course User Index [PS] Sharman Cheek, MD     ----------------------------------------- 11:35 AM on 08/23/2017 -----------------------------------------  Discussed with hospitalist  ____________________________________________   FINAL CLINICAL IMPRESSION(S) / ED DIAGNOSES  Final diagnoses:  Generalized weakness  Dysarthria  Acute ischemic stroke [facility])      New Prescriptions   No medications on file     Portions of this note were generated with dragon dictation software. Dictation errors may occur despite best attempts at proofreading.    Sharman Cheek, MD 08/23/17 1135

## 2017-08-23 NOTE — Progress Notes (Signed)
Pharmacy Antibiotic Note  Annette Wells is a 72 y.o. female admitted on 08/23/2017 with UTI.  Pharmacy has been consulted for ceftriaxone dosing.  Plan: Ceftriaxone 1 g IV daily  Height:  (154.9 cm) Weight: 233 lb 6.4 oz (105.9 kg) IBW/kg (Calculated) : 47.8  Temp (24hrs), Avg:97.9 F (36.6 C), Min:97.8 F (36.6 C), Max:98 F (36.7 C)   Recent Labs Lab 08/23/17 0823  WBC 5.3  CREATININE 0.91    Estimated Creatinine Clearance: 65.4 mL/min (by C-G formula based on SCr of 0.91 mg/dL).    No Known Allergies  Antimicrobials this admission: ceftriaxone 9/19 >>   Microbiology results: 9/19 UCx: Sent   Thank you for allowing pharmacy to be a part of this patient's care.  Cindi Carbon, PharmD, BCPS Clinical Pharmacist 08/23/2017 1:03 PM

## 2017-08-23 NOTE — ED Notes (Signed)
Cha[place]in at bedside

## 2017-08-23 NOTE — Progress Notes (Signed)
OT Cancellation Note  Patient Details Name: Annette Wells MRN: [id number] DOB: [birth year]   Cancelled Treatment:    Reason Eval/Treat Not Completed: Patient at procedure or test/ unavailable. Order received, chart reviewed. Pt out of room for testing. Family in room praying. Will re-attempt OT evaluation at later date/time as pt is available.  Richrd Prime, MPH, MS, OTR/L ascom [phone] 08/23/17, 2:06 PM

## 2017-08-23 NOTE — ED Notes (Signed)
Pt speech is clear at this time, pt able to lift arms bilaterally and LFT leg, pt has sciatic affecting RT leg. Pt family states pt is more at her baseline at this time, MD made aware

## 2017-08-23 NOTE — Progress Notes (Signed)
PT Cancellation Note  Patient Details Name: Annette Wells MRN: [id number] DOB: Dec 22, 1945   Cancelled Treatment:    Reason Eval/Treat Not Completed: Patient at procedure or test/unavailable (Pt currently off the floor.)  Will continue to follow acutely.   Encarnacion Chu PT, DPT 08/23/2017, 2:06 PM

## 2017-08-23 NOTE — ED Notes (Signed)
Per Dr. Scotty Court, nurse to repeat swallow screen due to patient's change in status: alert, talking without slurring.

## 2017-08-23 NOTE — Progress Notes (Addendum)
Called on call MD Dr Micki Riley after MRI results came in, per MD ok to discontinue NIH stroke scale and q2 nuero checks. continue Q4 nuero checks and Q4 vitals

## 2017-08-23 NOTE — Progress Notes (Signed)
CH arrived to consult with family as they were upset about the information that the patient had suffered a stroke. Nurse and Dr visited the patient while [facility] was in the room. Family has questions about staff and where patient could get the best care. CH provided prayer and emotional support. CH left room after Dr came in; will be paged if needed.   08/23/17 0912  Clinical Encounter Type  Visited With Patient;Family;Health care provider  Visit Type Initial;Spiritual support;ED  Referral From Family;Nurse  Spiritual Encounters  Spiritual Needs Prayer;Emotional  Stress Factors  Patient Stress Factors Health changes  Family Stress Factors Lack of knowledge

## 2017-08-24 ENCOUNTER — Observation Stay (HOSPITAL_BASED_OUTPATIENT_CLINIC_OR_DEPARTMENT_OTHER)
Admit: 2017-08-24 | Discharge: 2017-08-24 | Disposition: A | Discharge status: Home / Self Care | Payer: Medicare Other | Attending: Internal Medicine | Admitting: Internal Medicine

## 2017-08-24 DIAGNOSIS — G459 Transient cerebral ischemic attack, unspecified: Secondary | ICD-10-CM | POA: Diagnosis not present

## 2017-08-24 DIAGNOSIS — I371 Nonrheumatic pulmonary valve insufficiency: Secondary | ICD-10-CM

## 2017-08-24 LAB — LIPID PANEL
CHOLESTEROL: 125 mg/dL (ref 0–200)
HDL: 44 mg/dL (ref >40)
LDL Cholesterol: 62 mg/dL (ref 0–99)
Total CHOL/HDL Ratio: 2.8 RATIO
Triglycerides: 95 mg/dL (ref <150)
VLDL: 19 mg/dL (ref 0–40)

## 2017-08-24 LAB — HEMOGLOBIN A1C
Hgb A1c MFr Bld: 5.6 % (ref 4.8–5.6)
Mean Plasma Glucose: 114.02 mg/dL

## 2017-08-24 LAB — ECHOCARDIOGRAM COMPLETE
HEIGHTINCHES: 61 in
Weight: 3734.4 oz

## 2017-08-24 LAB — TSH: TSH: 1.927 u[IU]/mL (ref 0.350–4.500)

## 2017-08-24 MED ORDER — ATORVASTATIN CALCIUM 40 MG PO TABS: 40.0000 mg | ORAL_TABLET | Freq: Every day | ORAL | 0 refills | Status: AC

## 2017-08-24 MED ORDER — CEPHALEXIN 250 MG PO CAPS: 250.0000 mg | ORAL_CAPSULE | Freq: Two times a day (BID) | ORAL | 0 refills | Status: DC

## 2017-08-24 MED ORDER — CEPHALEXIN 500 MG PO CAPS: 500.0000 mg | ORAL_CAPSULE | Freq: Two times a day (BID) | ORAL | 0 refills | Status: AC

## 2017-08-24 MED ORDER — ENOXAPARIN SODIUM 40 MG/0.4ML ~~LOC~~ SOLN
40.0000 mg | Freq: Two times a day (BID) | SUBCUTANEOUS | Status: DC
  Administered 2017-08-24: 10:00:00 40 mg via SUBCUTANEOUS
  Filled 2017-08-24: qty 0.4

## 2017-08-24 NOTE — Progress Notes (Signed)
Patient discharged home. Joaopedro Eschbach S, RN  

## 2017-08-24 NOTE — Progress Notes (Signed)
*  PRELIMINARY RESULTS* Echocardiogram 2D Echocardiogram has been performed.  Annette Wells 08/24/2017, 9:03 AM

## 2017-08-24 NOTE — Progress Notes (Signed)
[employer] [facility] [place] at [facility] was admitted to the Hospital on 08/23/2017 and Discharged  08/24/2017 and her son should be excused from work for 1 days starting 08/23/2017 as he was with her as caregiver in hospital, may return to work without any restrictions on 08/25/2017.  Delfino Lovett M.D on 08/24/2017,at 10:06 AM  [employer] [facility] [place] at [facility]  [phone]

## 2017-08-24 NOTE — Progress Notes (Signed)
Discharge instructions given and went over with patient and patients son at bedside. Prescriptions reviewed. All questions answered. Patient to discharge home with son. Awaiting clothes to be provided by daughter. Bo Mcclintock, RN

## 2017-08-24 NOTE — Care Management Obs Status (Signed)
MEDICARE OBSERVATION STATUS NOTIFICATION   Patient Details  Name: Annette Wells MRN: [id number] Date of Birth: 21-Apr-1946   Medicare Observation Status Notification Given:  Yes Notice signed, one given to patient and the other to HIM for scanning    Eber Hong, RN 08/24/2017, 10:22 AM

## 2017-08-24 NOTE — Care Management (Signed)
Placed in observation for sx concerning for CVA. Independent in all adls, denies issues accessing medical care, obtaining medications or with transportation.  Current with her PCP. At present, patient is without deficits and do not anticipate any discharge needs. She does not feel that she will need any rehab services

## 2017-08-24 NOTE — Progress Notes (Signed)
SLP Cancellation Note  Patient Details Name: Annette Wells MRN: [id number] DOB: 10-06-1946   Cancelled treatment:       Reason Eval/Treat Not Completed: SLP screened, no needs identified, will sign off (chart reviewed; consulted NSG then met w/ pt/family) Pt denied any difficulty swallowing and is currently on a regular diet; tolerates swallowing pills w/ water per NSG. Pt conversed at conversational level w/out deficits noted; pt and family denied any speech-language deficits.  No further skilled ST services indicated as pt appears at her baseline. Pt agreed. NSG to reconsult if any change in status.    Orinda Kenner, MS, CCC-SLP Annette Wells 08/24/2017, 11:00 AM

## 2017-08-24 NOTE — Progress Notes (Signed)
lovenox dose increased to 40 BID due to BMI >40 and crcl >30  Kutter Schnepf D Jadarian Mckay, Pharm.D, BCPS Clinical Pharmacist

## 2017-08-24 NOTE — Evaluation (Signed)
Physical Therapy Evaluation Patient Details Name: Annette Wells MRN: [id number] DOB: 1946-08-26 Today's Date: 08/24/2017   History of Present Illness  Pt is a 71 y.o.femalewith a known history of Hypertension, hyperlipidemia and degenerative disc disease. The patient presents in the ED with complaints of weakness and slurred speech. The patient was noticed to have slurred speech and confusion at about 7:00 AM 08/23/17. She complains of dizziness and generalized weakness. She also complains of dysuria and urinary frequency for 2 weeks. Source patient confusion improved in the ED. A CAT scan of the head didn't show any abnormality.    Clinical Impression  Pt reports feeling as though all deficits that brought her to the ED have resolved and that she has returned to baseline level of function.  Pt able to perform all bed mobility tasks Ind without extra time or effort.  Pt Ind with transfers from multiple height surfaces with good stability and eccentric and concentric control.  Pt easily ambulated 200+ feet independently without AD and with very good stability. Will discharge PT orders at this time but will reassess patient pending a change in status upon receipt of new PT orders.      Follow Up Recommendations No PT follow up    Equipment Recommendations  None recommended by PT    Recommendations for Other Services       Precautions / Restrictions Precautions Precautions: None Restrictions Weight Bearing Restrictions: No      Mobility  Bed Mobility Overal bed mobility: Independent             General bed mobility comments: No extra time or effort required with bed mobility tasks  Transfers Overall transfer level: Independent Equipment used: None             General transfer comment: Pt steady with good power standing from various height surfaces  Ambulation/Gait Ambulation/Gait assistance: Independent Ambulation Distance (Feet): 200 Feet Assistive device:  None Gait Pattern/deviations: WFL(Within Functional Limits)   Gait velocity interpretation: at or above normal speed for age/gender General Gait Details: Pt steady with good cadence and B step length during amb without an AD  Stairs            Wheelchair Mobility    Modified Rankin (Stroke Patients Only)       Balance Overall balance assessment: Independent                                           Pertinent Vitals/Pain Pain Assessment: No/denies pain    Home Living Family/patient expects to be discharged to:: Private residence Living Arrangements: Spouse/significant other;Children Available Help at Discharge: Family;Available PRN/intermittently Type of Home: House Home Access: Stairs to enter Entrance Stairs-Rails: Right;Left;Can reach both Entrance Stairs-Number of Steps: 3 Home Layout: One level Home Equipment: Cane - quad      Prior Function Level of Independence: Independent         Comments: Pt Ind with amb community distances without AD, no fall history, Ind with ADLs     Hand Dominance        Extremity/Trunk Assessment   Upper Extremity Assessment Upper Extremity Assessment: Overall WFL for tasks assessed    Lower Extremity Assessment Lower Extremity Assessment: Overall WFL for tasks assessed       Communication   Communication: No difficulties  Cognition Arousal/Alertness: Awake/alert Behavior During Therapy: WFL for tasks assessed/performed Overall  Cognitive Status: Within Functional Limits for tasks assessed                                        General Comments      Exercises     Assessment/Plan    PT Assessment Patent does not need any further PT services  PT Problem List         PT Treatment Interventions      PT Goals (Current goals can be found in the Care Plan section)  Acute Rehab PT Goals Patient Stated Goal: To return home PT Goal Formulation: With patient Time For Goal  Achievement: 09/06/17 Potential to Achieve Goals: Good    Frequency     Barriers to discharge        Co-evaluation               AM-PAC PT "6 Clicks" Daily Activity  Outcome Measure Difficulty turning over in bed (including adjusting bedclothes, sheets and blankets)?: None Difficulty moving from lying on back to sitting on the side of the bed? : None Difficulty sitting down on and standing up from a chair with arms (e.g., wheelchair, bedside commode, etc,.)?: None Help needed moving to and from a bed to chair (including a wheelchair)?: None Help needed walking in hospital room?: None Help needed climbing 3-5 steps with a railing? : None 6 Click Score: 24    End of Session Equipment Utilized During Treatment: Gait belt Activity Tolerance: Patient tolerated treatment well Patient left: in chair;with call bell/phone within reach;with family/visitor present Nurse Communication: Mobility status PT Visit Diagnosis: Muscle weakness (generalized) (M62.81)    Time: 6962-9528 PT Time Calculation (min) (ACUTE ONLY): 24 min   Charges:   PT Evaluation $PT Eval Low Complexity: 1 Low     PT G Codes:   PT G-Codes **NOT FOR INPATIENT CLASS** Functional Assessment Tool Used: AM-PAC 6 Clicks Basic Mobility Functional Limitation: Mobility: Walking and moving around Mobility: Walking and Moving Around Current Status (U1324): 0 percent impaired, limited or restricted Mobility: Walking and Moving Around Goal Status (M0102): 0 percent impaired, limited or restricted Mobility: Walking and Moving Around Discharge Status (V2536): 0 percent impaired, limited or restricted    D. Scott Eamon Tantillo PT, DPT 08/24/17, 11:42 AM

## 2017-08-24 NOTE — Discharge Instructions (Signed)

## 2017-08-25 LAB — URINE CULTURE: CULTURE: NO GROWTH

## 2017-08-25 NOTE — Discharge Summary (Signed)
[employer] [facility] [place] [facility]   PATIENT NAME: Annette Wells    MR#:  [id number]  DATE OF BIRTH:  1946/02/01  DATE OF ADMISSION:  08/23/2017   ADMITTING PHYSICIAN: Shaune Pollack, MD  DATE OF DISCHARGE: 08/24/2017  1:45 PM  PRIMARY CARE PHYSICIAN: Mickey Farber, MD   ADMISSION DIAGNOSIS:  Other specified transient cerebral ischemias [G45.8] Dysarthria [R47.1] Generalized weakness [R53.1] DISCHARGE DIAGNOSIS:  Active Problems:   TIA (transient ischemic attack)  SECONDARY DIAGNOSIS:   Past Medical History:  Diagnosis Date  . DDD (degenerative disc disease), lumbar   . GERD (gastroesophageal reflux disease)   . Glucose intolerance (impaired glucose tolerance)   . High cholesterol   . Hypertension   . Osteopenia    HOSPITAL COURSE:   71 y.o. female with a known history of Hypertension, hyperlipidemia and degenerative disc disease. The patient was noticed slurred speech and confusion which was resolved in ED. She complains of dizziness and generalized weakness. She also complains of dysuria and urinary frequency for 2 weeks. Source patient confusion improved in the ED. A CAT scan of the head didn't show any abnormality. She was given aspirin and admitted  Her symptoms were thought to be due to UTI and urine drug screen was positive for [facility] which might be the contributor as well. She is feeling much better.  Urine c/s had no growth  DISCHARGE CONDITIONS:  stable CONSULTS OBTAINED:   DRUG ALLERGIES:  No Known Allergies DISCHARGE MEDICATIONS:   Allergies as of 08/24/2017   No Known Allergies     Medication List    STOP taking these medications   predniSONE 10 MG tablet Commonly known as:  DELTASONE     TAKE these medications   aspirin 81 MG chewable tablet Chew 81 mg by mouth daily.   atorvastatin 40 MG tablet Commonly known as:  LIPITOR Take 1 tablet (40 mg total) by mouth daily at 6 PM.   cephALEXin 500 MG capsule Commonly known as:   KEFLEX Take 1 capsule (500 mg total) by mouth 2 (two) times daily.   diltiazem 360 MG 24 hr capsule Commonly known as:  TIAZAC Take 360 mg by mouth daily.   losartan-hydrochlorothiazide 100-25 MG tablet Commonly known as:  HYZAAR Take 1 tablet by mouth daily.   tiZANidine 4 MG tablet Commonly known as:  ZANAFLEX Take 4 mg by mouth at bedtime as needed.            Discharge Care Instructions        Start     Ordered   08/24/17 0000  atorvastatin (LIPITOR) 40 MG tablet  Daily-1800     08/24/17 1008   08/24/17 0000  Increase activity slowly     08/24/17 1008   08/24/17 0000  Diet - low sodium heart healthy     08/24/17 1008   08/24/17 0000  cephALEXin (KEFLEX) 500 MG capsule  2 times daily     08/24/17 1044       DISCHARGE INSTRUCTIONS:   DIET:  Regular diet DISCHARGE CONDITION:  Good ACTIVITY:  Activity as tolerated OXYGEN:  Home Oxygen: No.  Oxygen Delivery: room air DISCHARGE LOCATION:  home   If you experience worsening of your admission symptoms, develop shortness of breath, life threatening emergency, suicidal or homicidal thoughts you must seek medical attention immediately by calling 911 or calling your MD immediately  if symptoms less severe.  You Must read complete instructions/literature along with all the possible adverse reactions/side  effects for all the Medicines you take and that have been prescribed to you. Take any new Medicines after you have completely understood and accpet all the possible adverse reactions/side effects.   Please note  You were cared for by a hospitalist during your hospital stay. If you have any questions about your discharge medications or the care you received while you were in the hospital after you are discharged, you can call the unit and asked to speak with the hospitalist on call if the hospitalist that took care of you is not available. Once you are discharged, your primary care physician will handle any further  medical issues. Please note that NO REFILLS for any discharge medications will be authorized once you are discharged, as it is imperative that you return to your primary care physician (or establish a relationship with a primary care physician if you do not have one) for your aftercare needs so that they can reassess your need for medications and monitor your lab values.    On the day of Discharge:  VITAL SIGNS:  Blood pressure (!) 154/84, pulse 62, temperature 98 F (36.7 C), temperature source Oral, resp. rate 18, height  (1.549 m), weight 105.9 kg (233 lb 6.4 oz), SpO2 98 %. PHYSICAL EXAMINATION:  GENERAL:  71 y.o.-year-old patient lying in the bed with no acute distress.  EYES: Pupils equal, round, reactive to light and accommodation. No scleral icterus. Extraocular muscles intact.  HEENT: Head atraumatic, normocephalic. Oropharynx and nasopharynx clear.  NECK:  Supple, no jugular venous distention. No thyroid enlargement, no tenderness.  LUNGS: Normal breath sounds bilaterally, no wheezing, rales,rhonchi or crepitation. No use of accessory muscles of respiration.  CARDIOVASCULAR: S1, S2 normal. No murmurs, rubs, or gallops.  ABDOMEN: Soft, non-tender, non-distended. Bowel sounds present. No organomegaly or mass.  EXTREMITIES: No pedal edema, cyanosis, or clubbing.  NEUROLOGIC: Cranial nerves II through XII are intact. Muscle strength 5/5 in all extremities. Sensation intact. Gait not checked.  PSYCHIATRIC: The patient is alert and oriented x 3.  SKIN: No obvious rash, lesion, or ulcer.  DATA REVIEW:   CBC  Recent Labs Lab 08/23/17 0823  WBC 5.3  HGB 14.6  HCT 42.8  PLT 178    Chemistries   Recent Labs Lab 08/23/17 0823  NA 141  K 3.8  CL 106  CO2 28  GLUCOSE 150*  BUN 23*  CREATININE 0.91  CALCIUM 10.0  AST 32  ALT 28  ALKPHOS 84  BILITOT 0.5    Follow-up Information    Mickey Farber, MD. Go on 08/31/2017.   Specialty:  Internal Medicine Why:  at 1:30  PM Contact information: [address] [facility] [place] [address] [phone]        Morene Crocker, MD. Go on 09/05/2017.   Specialty:  Neurology Why:  at 2:15 PM Contact information: [address] [facility] [place] [address] [phone]          Management plans discussed with the patient, family and they are in agreement.  CODE STATUS: Prior   TOTAL TIME TAKING CARE OF THIS PATIENT: 45 minutes.    Delfino Lovett M.D on 08/25/2017 at 10:00 AM  Between 7am to 6pm - Pager - [phone]  After 6pm go to [email or web address] - Scientist, research (life sciences) [facility]  Office  [phone]  CC: Primary care physician; Mickey Farber, MD   Note: This dictation was prepared with Dragon dictation along with smaller phrase  technology. Any transcriptional errors that result from this process are unintentional.

## 2017-12-07 ENCOUNTER — Other Ambulatory Visit: Payer: Self-pay | Admitting: Internal Medicine

## 2017-12-07 DIAGNOSIS — Z1239 Encounter for other screening for malignant neoplasm of breast: Secondary | ICD-10-CM

## 2017-12-19 ENCOUNTER — Ambulatory Visit
Admission: RE | Admit: 2017-12-19 | Discharge: 2017-12-19 | Disposition: A | Discharge status: Home / Self Care | Payer: Medicare Other | Source: Ambulatory Visit | Attending: Internal Medicine | Admitting: Internal Medicine

## 2017-12-19 DIAGNOSIS — Z1231 Encounter for screening mammogram for malignant neoplasm of breast: Secondary | ICD-10-CM | POA: Diagnosis not present

## 2017-12-19 DIAGNOSIS — Z1239 Encounter for other screening for malignant neoplasm of breast: Secondary | ICD-10-CM

## 2018-12-10 ENCOUNTER — Other Ambulatory Visit: Payer: Self-pay | Admitting: Internal Medicine

## 2018-12-10 DIAGNOSIS — Z1231 Encounter for screening mammogram for malignant neoplasm of breast: Secondary | ICD-10-CM

## 2018-12-25 ENCOUNTER — Ambulatory Visit
Admission: RE | Admit: 2018-12-25 | Discharge: 2018-12-25 | Disposition: A | Discharge status: Home / Self Care | Payer: Medicare Other | Source: Ambulatory Visit | Attending: Internal Medicine | Admitting: Internal Medicine

## 2018-12-25 ENCOUNTER — Encounter (INDEPENDENT_AMBULATORY_CARE_PROVIDER_SITE_OTHER): Payer: Self-pay

## 2018-12-25 DIAGNOSIS — Z1231 Encounter for screening mammogram for malignant neoplasm of breast: Secondary | ICD-10-CM | POA: Insufficient documentation

## 2019-01-01 ENCOUNTER — Other Ambulatory Visit: Payer: Self-pay

## 2019-01-01 ENCOUNTER — Encounter
Admission: RE | Admit: 2019-01-01 | Discharge: 2019-01-01 | Disposition: A | Discharge status: Home / Self Care | Payer: Medicare Other | Source: Ambulatory Visit | Attending: Unknown Physician Specialty | Admitting: Unknown Physician Specialty

## 2019-01-01 DIAGNOSIS — I10 Essential (primary) hypertension: Secondary | ICD-10-CM | POA: Diagnosis not present

## 2019-01-01 DIAGNOSIS — Z01818 Encounter for other preprocedural examination: Secondary | ICD-10-CM | POA: Diagnosis present

## 2019-01-01 DIAGNOSIS — Z8673 Personal history of transient ischemic attack (TIA), and cerebral infarction without residual deficits: Secondary | ICD-10-CM | POA: Diagnosis not present

## 2019-01-01 DIAGNOSIS — R9431 Abnormal electrocardiogram [ECG] [EKG]: Secondary | ICD-10-CM | POA: Diagnosis not present

## 2019-01-01 HISTORY — DX: Osteomyelitis, unspecified: M86.9

## 2019-01-01 HISTORY — DX: Cerebral infarction, unspecified: I63.9

## 2019-01-01 NOTE — Patient Instructions (Signed)
Your procedure is scheduled on: Wednesday, January 09, 2019  Report to THE SECOND FLOOR OF THE MEDICAL MALL    DO NOT STOP ON THE FIRST FLOOR TO REGISTER  To find out your arrival time please call [phone] between 1PM - 3PM on Tuesday, January 08, 2019  Remember: Instructions that are not followed completely may result in serious medical risk,  up to and including death, or upon the discretion of your surgeon and anesthesiologist your  surgery may need to be rescheduled.     _X__ 1. Do not eat food after midnight the night before your procedure.                 No gum chewing or hard candies.                   ABSOLUTELY NOTHING SOLID IN YOUR MOUTH AFTER MIDNIGHT                  You may drink clear liquids up to 2 hours before you are scheduled to arrive for your surgery-                  DO not drink clear liquids within 2 hours of the start of your surgery.                  Clear Liquids include:  water, apple juice without pulp, clear carbohydrate                 drink such as Clearfast of Gatorade, Black Coffee or Tea (Do not add                 anything to coffee or tea).  __X__2.  On the morning of surgery brush your teeth with toothpaste and water, you                may rinse your mouth with mouthwash if you wish.  Do not swallow any toothpaste of mouthwash.     _X__ 3.  No Alcohol for 24 hours before or after surgery.   _X__ 4.  Do Not Smoke or use e-cigarettes For 24 Hours Prior to Your Surgery.                 Do not use any chewable tobacco products for at least 6 hours prior to                 surgery.  ____  5.  Bring all medications with you on the day of surgery if instructed.   ____  6.  Notify your doctor if there is any change in your medical condition      (cold, fever, infections).     Do not wear jewelry, make-up, hairpins, clips or nail polish. Do not wear lotions, powders, or perfumes. You may wear deodorant. Do not shave 48  hours prior to surgery. Men may shave face and neck. Do not bring valuables to the hospital.    [facility] is not responsible for any belongings or valuables.  Contacts, dentures or bridgework may not be worn into surgery. Leave your suitcase in the car. After surgery it may be brought to your room. For patients admitted to the hospital, discharge time is determined by your treatment team.   Patients discharged the day of surgery will not be allowed to drive home.   Please read over the following fact sheets that you were given:   PREPARING  FOR SURGERY   ____ Take these medicines the morning of surgery with A SIP OF WATER:    1. DILTIAZEM  2.   3.   4.  5.  6.  ____ Fleet Enema (as directed)   _X___ Use CHG Soap as directed  ____ Use inhalers on the day of surgery  __X__ Stop /Plavix/aspirin PER DR. Harrington ChallengerHIES .  PLEASE CALL HIS OFFICE BY LATE Wednesday                01/02/2019 IF YOU HAVE NOT HEARD ABOUT STOPPING THE PLAVIX. STOP ASPIRIN TODAY  _X___ Stop Anti-inflammatories AS OF TODAY                     TYLENOL IS OKAY AT ANY TIME PRIOR TO SURGERY   _X___ Stop supplements until after surgery.                STOP OS-CAL/ LACTOBACILLUS/ MAGNESIUM / MULTIVITS / FISH OIL / VITAMIN E   ____ Bring C-Pap to the hospital.   DO NOT TAKE HYDROCHLOROTHIAZIDE OR LOSARTAN  OR POTASSIUM ON THE DAY OF SURGERY,     BUT DO TAKE DAILY BETWEEN NOW AND THEN.  WEAR SOMETHING LOOSE FITTING TO BE ABLE TO GET A HAND/WRIST WRAP INTO.

## 2019-01-01 NOTE — Pre-Procedure Instructions (Signed)
EKG OK BY DR FITZGERALD 

## 2019-01-09 ENCOUNTER — Other Ambulatory Visit: Payer: Self-pay

## 2019-01-09 ENCOUNTER — Ambulatory Visit: Payer: Medicare Other | Admitting: Anesthesiology

## 2019-01-09 ENCOUNTER — Encounter
Admission: RE | Disposition: A | Discharge status: Home / Self Care | Payer: Self-pay | Source: Home / Self Care | Attending: Unknown Physician Specialty

## 2019-01-09 ENCOUNTER — Ambulatory Visit
Admission: RE | Admit: 2019-01-09 | Discharge: 2019-01-09 | Disposition: A | Discharge status: Home / Self Care | Payer: Medicare Other | Attending: Unknown Physician Specialty | Admitting: Unknown Physician Specialty

## 2019-01-09 DIAGNOSIS — Z79899 Other long term (current) drug therapy: Secondary | ICD-10-CM | POA: Insufficient documentation

## 2019-01-09 DIAGNOSIS — I10 Essential (primary) hypertension: Secondary | ICD-10-CM | POA: Insufficient documentation

## 2019-01-09 DIAGNOSIS — K219 Gastro-esophageal reflux disease without esophagitis: Secondary | ICD-10-CM | POA: Diagnosis not present

## 2019-01-09 DIAGNOSIS — Z7902 Long term (current) use of antithrombotics/antiplatelets: Secondary | ICD-10-CM | POA: Diagnosis not present

## 2019-01-09 DIAGNOSIS — Z8673 Personal history of transient ischemic attack (TIA), and cerebral infarction without residual deficits: Secondary | ICD-10-CM | POA: Insufficient documentation

## 2019-01-09 DIAGNOSIS — E785 Hyperlipidemia, unspecified: Secondary | ICD-10-CM | POA: Insufficient documentation

## 2019-01-09 DIAGNOSIS — G5602 Carpal tunnel syndrome, left upper limb: Secondary | ICD-10-CM | POA: Diagnosis not present

## 2019-01-09 DIAGNOSIS — M79642 Pain in left hand: Secondary | ICD-10-CM | POA: Diagnosis present

## 2019-01-09 DIAGNOSIS — Z7982 Long term (current) use of aspirin: Secondary | ICD-10-CM | POA: Diagnosis not present

## 2019-01-09 HISTORY — PX: CARPAL TUNNEL RELEASE: SHX101

## 2019-01-09 SURGERY — CARPAL TUNNEL RELEASE
Anesthesia: General | Laterality: Left

## 2019-01-09 MED ORDER — MIDAZOLAM HCL 2 MG/2ML IJ SOLN
INTRAMUSCULAR | Status: AC
  Filled 2019-01-09: qty 2

## 2019-01-09 MED ORDER — FAMOTIDINE 20 MG PO TABS
20.0000 mg | ORAL_TABLET | Freq: Once | ORAL | Status: AC
  Administered 2019-01-09: 20 mg via ORAL

## 2019-01-09 MED ORDER — LIDOCAINE HCL (CARDIAC) PF 100 MG/5ML IV SOSY
PREFILLED_SYRINGE | INTRAVENOUS | Status: DC | PRN
  Administered 2019-01-09: 100 mg via INTRAVENOUS

## 2019-01-09 MED ORDER — LACTATED RINGERS IV SOLN
INTRAVENOUS | Status: DC
  Administered 2019-01-09: 07:00:00 via INTRAVENOUS

## 2019-01-09 MED ORDER — FENTANYL CITRATE (PF) 100 MCG/2ML IJ SOLN
INTRAMUSCULAR | Status: AC
  Filled 2019-01-09: qty 2

## 2019-01-09 MED ORDER — PROPOFOL 10 MG/ML IV BOLUS
INTRAVENOUS | Status: DC | PRN
  Administered 2019-01-09: 150 mg via INTRAVENOUS

## 2019-01-09 MED ORDER — FAMOTIDINE 20 MG PO TABS
ORAL_TABLET | ORAL | Status: AC
  Administered 2019-01-09: 20 mg via ORAL
  Filled 2019-01-09: qty 1

## 2019-01-09 MED ORDER — DEXAMETHASONE SODIUM PHOSPHATE 10 MG/ML IJ SOLN
INTRAMUSCULAR | Status: DC | PRN
  Administered 2019-01-09: 6 mg via INTRAVENOUS

## 2019-01-09 MED ORDER — BUPIVACAINE HCL (PF) 0.5 % IJ SOLN
INTRAMUSCULAR | Status: DC | PRN
  Administered 2019-01-09: 6 mL

## 2019-01-09 MED ORDER — ONDANSETRON HCL 4 MG/2ML IJ SOLN: 4.0000 mg | Freq: Once | INTRAMUSCULAR | Status: DC | PRN

## 2019-01-09 MED ORDER — BUPIVACAINE HCL (PF) 0.5 % IJ SOLN
INTRAMUSCULAR | Status: AC
  Filled 2019-01-09: qty 30

## 2019-01-09 MED ORDER — PROPOFOL 10 MG/ML IV BOLUS
INTRAVENOUS | Status: AC
  Filled 2019-01-09: qty 20

## 2019-01-09 MED ORDER — FENTANYL CITRATE (PF) 100 MCG/2ML IJ SOLN
INTRAMUSCULAR | Status: DC | PRN
  Administered 2019-01-09 (×2): 50 ug via INTRAVENOUS

## 2019-01-09 MED ORDER — MIDAZOLAM HCL 2 MG/2ML IJ SOLN
INTRAMUSCULAR | Status: DC | PRN
  Administered 2019-01-09: 1 mg via INTRAVENOUS

## 2019-01-09 MED ORDER — FENTANYL CITRATE (PF) 100 MCG/2ML IJ SOLN
INTRAMUSCULAR | Status: AC
  Administered 2019-01-09: 25 ug via INTRAVENOUS
  Filled 2019-01-09: qty 2

## 2019-01-09 MED ORDER — FENTANYL CITRATE (PF) 100 MCG/2ML IJ SOLN
25.0000 ug | INTRAMUSCULAR | Status: DC | PRN
  Administered 2019-01-09 (×2): 25 ug via INTRAVENOUS

## 2019-01-09 SURGICAL SUPPLY — 27 items
BANDAGE ELASTIC 2 LF NS (GAUZE/BANDAGES/DRESSINGS) ×2 IMPLANT
BNDG ESMARK 4X12 TAN STRL LF (GAUZE/BANDAGES/DRESSINGS) ×2 IMPLANT
CHLORAPREP W/TINT 26ML (MISCELLANEOUS) ×3 IMPLANT
COVER WAND RF STERILE (DRAPES) ×2 IMPLANT
CUFF TOURN 18 STER (MISCELLANEOUS) ×2 IMPLANT
ELECT REM PT RETURN 9FT ADLT (ELECTROSURGICAL) ×2
ELECTRODE REM PT RTRN 9FT ADLT (ELECTROSURGICAL) ×1 IMPLANT
GAUZE SPONGE 4X4 12PLY STRL (GAUZE/BANDAGES/DRESSINGS) ×2 IMPLANT
GLOVE BIO SURGEON STRL SZ8 (GLOVE) ×2 IMPLANT
GLOVE BIOGEL M STRL SZ7.5 (GLOVE) ×3 IMPLANT
GLOVE INDICATOR 8.0 STRL GRN (GLOVE) ×2 IMPLANT
GOWN STRL REUS W/ TWL LRG LVL3 (GOWN DISPOSABLE) ×2 IMPLANT
GOWN STRL REUS W/TWL LRG LVL3 (GOWN DISPOSABLE) ×2
GOWN STRL REUS W/TWL LRG LVL4 (GOWN DISPOSABLE) ×2 IMPLANT
KIT TURNOVER KIT A (KITS) ×2 IMPLANT
NS IRRIG 500ML POUR BTL (IV SOLUTION) ×2 IMPLANT
PACK EXTREMITY ARMC (MISCELLANEOUS) ×2 IMPLANT
PADDING CAST 2X4YD ST (MISCELLANEOUS) ×1
PADDING CAST BLEND 2X4 STRL (MISCELLANEOUS) ×1 IMPLANT
SOL PREP PVP 2OZ (MISCELLANEOUS) ×2
SOLUTION PREP PVP 2OZ (MISCELLANEOUS) ×1 IMPLANT
SPLINT CAST 1 STEP 3X12 (MISCELLANEOUS) ×2 IMPLANT
STOCKINETTE 48X4 2 PLY STRL (GAUZE/BANDAGES/DRESSINGS) ×1 IMPLANT
STOCKINETTE STRL 4IN 9604848 (GAUZE/BANDAGES/DRESSINGS) ×2 IMPLANT
SUT ETHILON 4-0 (SUTURE) ×1
SUT ETHILON 4-0 FS2 18XMFL BLK (SUTURE) ×1
SUTURE ETHLN 4-0 FS2 18XMF BLK (SUTURE) ×1 IMPLANT

## 2019-01-09 NOTE — Anesthesia Procedure Notes (Signed)
Procedure Name: LMA Insertion Date/Time: 01/09/2019 7:45 AM Performed by: Danelle Berry, CRNA Pre-anesthesia Checklist: Patient identified, Emergency Drugs available, Suction available, Patient being monitored and Timeout performed Patient Re-evaluated:Patient Re-evaluated prior to induction Oxygen Delivery Method: Circle system utilized and Simple face mask Preoxygenation: Pre-oxygenation with 100% oxygen Induction Type: IV induction Ventilation: Mask ventilation without difficulty LMA Size: 4.0 Placement Confirmation: positive ETCO2 Dental Injury: Teeth and Oropharynx as per pre-operative assessment

## 2019-01-09 NOTE — Anesthesia Post-op Follow-up Note (Signed)
Anesthesia QCDR form completed.        

## 2019-01-09 NOTE — Op Note (Signed)
DATE OF SURGERY:  01/09/2019  PATIENT NAME:  Annette Wells   DOB: 22-Dec-1945  MRN: [id number]  PRE-OPERATIVE DIAGNOSIS: Left carpal tunnel syndrome  POST-OPERATIVE DIAGNOSIS:  Same  PROCEDURE: Left carpal tunnel release  SURGEON: Dr. Erin SonsHarold Tykiera Raven, Montez HagemanJr. M.D.  ANESTHESIA: General   INDICATIONS FOR SURGERY: Annette HackSylvia V Gills is a 73 y.o. year old female with a long history of numbness and paresthesias in the left hand. Nerve conduction studies demonstrated findings consistent with significant median nerve compression.The patient had not seen any significant improvement despite conservative nonsurgical intervention. After discussion of the risks and benefits of surgical intervention, the patient expressed understanding of the risks benefits and agreed with plans for carpal tunnel release.   PROCEDURE IN DETAIL: The patient was taken the operating room where satisfactory general anesthesia was achieved. A tourniquet was placed on the patient's left upper arm.The left hand and arm were prepped  and draped in the usual sterile fashion. A "time-out" was performed as per usual protocol. The hand and forearm were exsanguinated using an Esmarch and the tourniquet was inflated to 250 mmHg.  An incision was made just ulnar to the thenar palmar crease. Dissection was carried down through the palmar fascia to the transverse carpal ligament. The transverse carpal ligament was sharply incised, taking care to protect the underlying structures within the carpal tunnel. Complete release of the transverse carpal ligament was achieved. There was no evidence of a mass or proliferative synovitis within the carpal tunnel. The median nerve did appear to be slightly flattened. The wound was irrigated with saline. The tourniquet was released at this time. It had been up for about eleven minutes. Bleeding was controlled with digital pressure and coagulation cautery. I did inject the subcutaneous tissue of the wound with about 5 cc  of 0.5% Marcaine without epinephrine. The skin was then re-approximated with interrupted sutures of #4-0 nylon. A sterile dressing was applied followed by application of a volar splint.  The patient was awakened and transferred to a stretcher bed.  The patient tolerated the procedure well and was transported to the PACU in stable condition. Blood loss was negligible.  Dr. Isidoro DonningHarold Bearett Porcaro, Jr. M.D.

## 2019-01-09 NOTE — Transfer of Care (Signed)
Immediate Anesthesia Transfer of Care Note  Patient: Annette Wells  Procedure(s) Performed: CARPAL TUNNEL RELEASE (Left )  Patient Location: PACU  Anesthesia Type:General  Level of Consciousness: awake, alert  and oriented  Airway & Oxygen Therapy: Patient Spontanous Breathing and Patient connected to face mask oxygen  Post-op Assessment: Report given to RN and Post -op Vital signs reviewed and stable  Post vital signs: Reviewed and stable  Last Vitals:  Vitals Value Taken Time  BP    Temp    Pulse 61 01/09/2019  8:38 AM  Resp 14 01/09/2019  8:38 AM  SpO2 99 % 01/09/2019  8:38 AM  Vitals shown include unvalidated device data.  Last Pain:  Vitals:   01/09/19 0629  TempSrc: Temporal  PainSc: 3          Complications: No apparent anesthesia complications

## 2019-01-09 NOTE — Anesthesia Postprocedure Evaluation (Signed)
Anesthesia Post Note  Patient: Mervin HackSylvia V Mamula  Procedure(s) Performed: CARPAL TUNNEL RELEASE (Left )  Patient location during evaluation: PACU Anesthesia Type: General Level of consciousness: awake and alert Pain management: pain level controlled Vital Signs Assessment: post-procedure vital signs reviewed and stable Respiratory status: spontaneous breathing and respiratory function stable Cardiovascular status: stable Anesthetic complications: no     Last Vitals:  Vitals:   01/09/19 0629 01/09/19 0839  BP: (!) 172/79   Pulse: 64   Resp: 19   Temp: 36.6 C (!) (P) 36.4 C  SpO2: 99%     Last Pain:  Vitals:   01/09/19 0839  TempSrc:   PainSc: (P) Asleep                 Sharnese Heath K

## 2019-01-09 NOTE — Anesthesia Preprocedure Evaluation (Signed)
Anesthesia Evaluation  Patient identified by MRN, date of birth, ID band Patient awake    Reviewed: Allergy & Precautions, NPO status , Patient's Chart, lab work & pertinent test results  History of Anesthesia Complications Negative for: history of anesthetic complications  Airway Mallampati: III       Dental  (+) Partial Lower, Partial Upper   Pulmonary neg sleep apnea, neg COPD, Current Smoker,           Cardiovascular hypertension, Pt. on medications (-) Past MI and (-) CHF (-) dysrhythmias (-) Valvular Problems/Murmurs     Neuro/Psych neg Seizures TIA (whole body weakness, disorientation)No Residual Symptoms    GI/Hepatic Neg liver ROS, GERD  Medicated and Controlled,  Endo/Other  neg diabetes  Renal/GU negative Renal ROS     Musculoskeletal   Abdominal   Peds  Hematology   Anesthesia Other Findings   Reproductive/Obstetrics                             Anesthesia Physical Anesthesia Plan  ASA: III  Anesthesia Plan: General   Post-op Pain Management:    Induction: Intravenous  PONV Risk Score and Plan: 2 and Dexamethasone and Ondansetron  Airway Management Planned: LMA  Additional Equipment:   Intra-op Plan:   Post-operative Plan:   Informed Consent: I have reviewed the patients History and Physical, chart, labs and discussed the procedure including the risks, benefits and alternatives for the proposed anesthesia with the patient or authorized representative who has indicated his/her understanding and acceptance.       Plan Discussed with:   Anesthesia Plan Comments:         Anesthesia Quick Evaluation

## 2019-01-09 NOTE — OR Nursing (Signed)
Discharge instructions discussed with pt and husband.

## 2019-01-09 NOTE — H&P (Signed)
  H and P reviewed. No changes. Uploaded at later date. 

## 2019-01-09 NOTE — Discharge Instructions (Signed)
Ice pack ° °Elevation ° °RTC in about 2 weeks °

## 2019-01-10 NOTE — Addendum Note (Signed)
Addendum  created 01/10/19 1253 by Stormy Fabian, CRNA   Charge Capture section accepted

## 2019-12-02 ENCOUNTER — Other Ambulatory Visit: Payer: Self-pay | Admitting: Internal Medicine

## 2019-12-02 DIAGNOSIS — Z1231 Encounter for screening mammogram for malignant neoplasm of breast: Secondary | ICD-10-CM

## 2020-01-01 ENCOUNTER — Other Ambulatory Visit: Payer: Self-pay

## 2020-01-01 ENCOUNTER — Ambulatory Visit
Admission: RE | Admit: 2020-01-01 | Discharge: 2020-01-01 | Disposition: A | Discharge status: Home / Self Care | Payer: Medicare Other | Source: Ambulatory Visit | Attending: Internal Medicine | Admitting: Internal Medicine

## 2020-01-01 DIAGNOSIS — Z1231 Encounter for screening mammogram for malignant neoplasm of breast: Secondary | ICD-10-CM

## 2020-03-15 ENCOUNTER — Encounter: Payer: Self-pay | Admitting: Emergency Medicine

## 2020-03-15 ENCOUNTER — Emergency Department
Admission: EM | Admit: 2020-03-15 | Discharge: 2020-03-15 | Disposition: A | Discharge status: Home / Self Care | Payer: Medicare Other | Attending: Emergency Medicine | Admitting: Emergency Medicine

## 2020-03-15 ENCOUNTER — Other Ambulatory Visit: Payer: Self-pay

## 2020-03-15 DIAGNOSIS — Z79899 Other long term (current) drug therapy: Secondary | ICD-10-CM | POA: Diagnosis not present

## 2020-03-15 DIAGNOSIS — I10 Essential (primary) hypertension: Secondary | ICD-10-CM | POA: Diagnosis not present

## 2020-03-15 DIAGNOSIS — M79604 Pain in right leg: Secondary | ICD-10-CM | POA: Diagnosis present

## 2020-03-15 DIAGNOSIS — F17210 Nicotine dependence, cigarettes, uncomplicated: Secondary | ICD-10-CM | POA: Diagnosis not present

## 2020-03-15 DIAGNOSIS — Z7902 Long term (current) use of antithrombotics/antiplatelets: Secondary | ICD-10-CM | POA: Insufficient documentation

## 2020-03-15 DIAGNOSIS — Z8673 Personal history of transient ischemic attack (TIA), and cerebral infarction without residual deficits: Secondary | ICD-10-CM | POA: Insufficient documentation

## 2020-03-15 DIAGNOSIS — M5431 Sciatica, right side: Secondary | ICD-10-CM | POA: Diagnosis not present

## 2020-03-15 DIAGNOSIS — Z7982 Long term (current) use of aspirin: Secondary | ICD-10-CM | POA: Insufficient documentation

## 2020-03-15 MED ORDER — HYDROCODONE-ACETAMINOPHEN 5-325 MG PO TABS
1.0000 | ORAL_TABLET | Freq: Once | ORAL | Status: AC
  Administered 2020-03-15: 1 via ORAL
  Filled 2020-03-15: qty 1

## 2020-03-15 MED ORDER — HYDROCODONE-ACETAMINOPHEN 5-325 MG PO TABS: 1.0000 | ORAL_TABLET | Freq: Four times a day (QID) | ORAL | 0 refills | Status: AC | PRN

## 2020-03-15 MED ORDER — PREDNISONE 10 MG PO TABS: ORAL_TABLET | ORAL | 0 refills | Status: AC

## 2020-03-15 NOTE — ED Provider Notes (Signed)
[facility] Emergency Department Provider Note  ____________________________________________   First MD Initiated Contact with Patient 03/15/20 [phone]     (approximate)  I have reviewed the triage vital signs and the nursing notes.   HISTORY  Chief Complaint Leg Pain   HPI Annette Wells is a 74 y.o. female presents to the ED with complaint of sciatica.  Patient states that she has had this before years ago and this is the same pain that she gets.  She states that the pain goes down her right leg.  She was doing some yard work yesterday and noticed the pain after finishing.  She has taken over-the-counter medication without any relief.  She denies any injury or fall while doing her yard work yesterday.  No urinary symptoms.  She rates her pain as a 10/10.     Past Medical History:  Diagnosis Date  . DDD (degenerative disc disease), lumbar   . GERD (gastroesophageal reflux disease)   . Glucose intolerance (impaired glucose tolerance)    prediabetic. watching her diet  . High cholesterol   . Hypertension   . Osteomyelitis (HCC)   . Osteopenia   . Stroke [facility]) 2018   TIA.  has been on plavix since then    Patient Active Problem List   Diagnosis Date Noted  . TIA (transient ischemic attack) 08/23/2017    Past Surgical History:  Procedure Laterality Date  . APPENDECTOMY    . CARPAL TUNNEL RELEASE Left 01/09/2019   Procedure: CARPAL TUNNEL RELEASE;  Surgeon: Erin Sons, MD;  Location: [facility] ORS;  Service: Orthopedics;  Laterality: Left;  . COLONOSCOPY WITH ESOPHAGOGASTRODUODENOSCOPY (EGD)    . COLONOSCOPY WITH PROPOFOL N/A 11/11/2016   Procedure: COLONOSCOPY WITH PROPOFOL;  Surgeon: Scot Jun, MD;  Location: [facility] ENDOSCOPY;  Service: Endoscopy;  Laterality: N/A;  . mandibular osteomyelitis    . TUBAL LIGATION      Prior to Admission medications   Medication Sig Start Date End Date Taking? Authorizing Provider  acetaminophen (TYLENOL) 325  MG tablet Take 650 mg by mouth every 6 (six) hours as needed for moderate pain.    [provider]  aspirin 81 MG tablet Take 81 mg by mouth daily.     [provider]  atorvastatin (LIPITOR) 40 MG tablet Take 1 tablet (40 mg total) by mouth daily at 6 PM. 08/24/17   Delfino Lovett, MD  calcium-vitamin D (OSCAL WITH D) 500-200 MG-UNIT tablet Take 1 tablet by mouth 2 (two) times daily.    [provider]  clopidogrel (PLAVIX) 75 MG tablet Take 75 mg by mouth daily.    [provider]  diltiazem (TIAZAC) 360 MG 24 hr capsule Take 360 mg by mouth daily. 08/14/17   [provider]  hydrochlorothiazide (HYDRODIURIL) 25 MG tablet Take 25 mg by mouth daily.    [provider]  HYDROcodone-acetaminophen (NORCO/VICODIN) 5-325 MG tablet Take 1 tablet by mouth every 6 (six) hours as needed for moderate pain. 03/15/20   Tommi Rumps, PA-C  lactobacillus acidophilus (BACID) TABS tablet Take 1 tablet by mouth daily.    [provider]  losartan (COZAAR) 100 MG tablet Take 100 mg by mouth daily.    [provider]  Magnesium 250 MG TABS Take 250 mg by mouth daily.    [provider]  Multiple Vitamin (MULTIVITAMIN WITH MINERALS) TABS tablet Take 1 tablet by mouth daily.    [provider]  Omega-3 Fatty Acids (OMEGA III  EPA+DHA PO) Take 1 capsule by mouth daily.    [provider]  Potassium 99 MG TABS Take 99 mg by mouth daily.    [provider]  predniSONE (DELTASONE) 10 MG tablet Take 6 tablets  today, on day 2 take 5 tablets, day 3 take 4 tablets, day 4 take 3 tablets, day 5 take  2 tablets and 1 tablet the last day 03/15/20   Tommi Rumps, PA-C  tiZANidine (ZANAFLEX) 2 MG tablet Take 2 mg by mouth at bedtime as needed for muscle spasms.  08/22/17   [provider]  vitamin E 400 UNIT capsule Take 400 Units by mouth daily.    [provider]    Allergies Flu virus vaccine and  Tramadol  Family History  Problem Relation Age of Onset  . Diabetes Mother   . Hypertension Mother   . Diabetes Father   . Cirrhosis Father   . Leukemia Father   . Diabetes Sister   . Stroke Sister   . Chronic Renal Failure Sister   . Breast cancer Neg Hx     Social History Social History   Tobacco Use  . Smoking status: Current Every Day Smoker    Packs/day: 0.50    Years: 15.00    Pack years: 7.50    Types: Cigarettes  . Smokeless tobacco: Never Used  . Tobacco comment: future plan  Substance Use Topics  . Alcohol use: No    Comment: very little  . Drug use: No    Review of Systems Constitutional: No fever/chills Cardiovascular: Denies chest pain. Respiratory: Denies shortness of breath. Gastrointestinal: No abdominal pain.  No nausea, no vomiting.  No diarrhea.  No constipation. Genitourinary: Negative for dysuria. Musculoskeletal: Positive for right leg radiculopathy. Skin: Negative for rash. Neurological: Negative for headaches, focal weakness or numbness. ____________________________________________   PHYSICAL EXAM:  VITAL SIGNS: ED Triage Vitals  Enc Vitals Group     BP 03/15/20 0856 (!) 166/70     Pulse Rate 03/15/20 0856 75     Resp 03/15/20 0856 18     Temp 03/15/20 0856 98.2 F (36.8 C)     Temp Source 03/15/20 0856 Oral     SpO2 03/15/20 0856 95 %     Weight 03/15/20 0857 225 lb (102.1 kg)     Height 03/15/20 0857 5\' 1"  (1.549 m)     Head Circumference --      Peak Flow --      Pain Score 03/15/20 0857 10     Pain Loc --      Pain Edu? --      Excl. in GC? --     Constitutional: Alert and oriented. Well appearing and in no acute distress. Eyes: Conjunctivae are normal.  Head: Atraumatic. Neck: No stridor.   Cardiovascular: Normal rate, regular rhythm. Grossly normal heart sounds.  Good peripheral circulation. Respiratory: Normal respiratory effort.  No retractions. Lungs CTAB. Gastrointestinal: Soft and nontender. No distention.   Musculoskeletal: Examination of the back there is no gross deformity and no point tenderness on palpation of the thoracic or lumbar spine.  Range of motion is slow and guarded.  Straight leg raises in the right leg is approximately 20 degrees with pain to her right SI joint area.  Left leg is approximately 70 degrees without any difficulties.  Good muscle strength bilaterally. Neurologic:  Normal speech and language. No gross focal neurologic deficits are appreciated.  Reflexes were 1+ bilaterally.   Skin:  Skin is warm, dry and intact. No rash noted. Psychiatric: Mood and affect are normal. Speech and behavior are normal.  ____________________________________________   LABS (all labs ordered are listed, but only abnormal results are displayed)  Labs Reviewed - No data to display   PROCEDURES  Procedure(s) performed (including Critical Care):  Procedures   ____________________________________________   INITIAL IMPRESSION / ASSESSMENT AND PLAN / ED COURSE  As part of my medical decision making, I reviewed the following data within the electronic MEDICAL RECORD NUMBER Notes from prior ED visits and [place] Controlled Substance Database  74 year old female presents to the ED with complaint of sciatica to the right side.  Patient states that she has had pain.  This is no different from her usual sciatica.  Patient states that she was doing some yard work yesterday and noticed some discomfort after finishing her work.  She has been taking over-the-counter medication at home without any relief.  She denies any injury or fall while in her yard yesterday.  Patient was given Norco while here in the emergency department.  Physical exam is consistent with sciatica.  Patient will start a prednisone taper as this is helped in the past.  A prescription for Norco was sent to her pharmacy also.  She is aware that this could cause drowsiness and increase her risk for injury.  She also will try ice or heat to the  area to see if this gives her any relief.  She will follow up with her PCP if any continued problems. ____________________________________________   FINAL CLINICAL IMPRESSION(S) / ED DIAGNOSES  Final diagnoses:  Sciatica of right side     ED Discharge Orders         Ordered    predniSONE (DELTASONE) 10 MG tablet     03/15/20 1047    HYDROcodone-acetaminophen (NORCO/VICODIN) 5-325 MG tablet  Every 6 hours PRN     03/15/20 1047           Note:  This document was prepared using Dragon voice recognition software and may include unintentional dictation errors.    Johnn Hai, PA-C 03/15/20 1612    Harvest Dark, MD 03/16/20 1347

## 2020-03-15 NOTE — Discharge Instructions (Signed)
Follow-up with your primary care provider if any continued problems or concerns.  Ice or heat to your back as needed for discomfort.  Try one and then the other to see which gives you more relief.  Begin taking the prednisone taper beginning with 6 tablets today and tapering down by 1 tablet each day.  Also a prescription for hydrocodone with acetaminophen was sent to the pharmacy.  This is a pain medication that could cause drowsiness.  Be aware that this may also increase your risk for falling

## 2020-03-15 NOTE — ED Triage Notes (Signed)
Pt states that she has sciatica and usually she can deal with it but Wednesday she went out in the yard to help her husband pull weeds and she has not been able to handle the pain or get it under control like usual. Pt states the pain is all the way from groin to ankle on the right side.

## 2020-03-15 NOTE — ED Notes (Signed)
See triage note  Presents with pain to right groin area  States she noticed pain after working in the yard  Pain has gotten worse

## 2020-04-22 ENCOUNTER — Other Ambulatory Visit: Payer: Self-pay | Admitting: Internal Medicine

## 2020-04-22 DIAGNOSIS — K828 Other specified diseases of gallbladder: Secondary | ICD-10-CM

## 2020-04-30 ENCOUNTER — Other Ambulatory Visit: Payer: Self-pay

## 2020-04-30 ENCOUNTER — Ambulatory Visit
Admission: RE | Admit: 2020-04-30 | Discharge: 2020-04-30 | Disposition: A | Discharge status: Home / Self Care | Payer: Medicare Other | Source: Ambulatory Visit | Attending: Internal Medicine | Admitting: Internal Medicine

## 2020-04-30 DIAGNOSIS — K828 Other specified diseases of gallbladder: Secondary | ICD-10-CM | POA: Diagnosis not present

## 2020-05-29 IMAGING — MG DIGITAL SCREENING BILATERAL MAMMOGRAM WITH TOMO AND CAD
6 of 12 series · 6 of 36 positions shown · non-contrast
Comparison: Previous exam(s).

CLINICAL DATA: Screening.

EXAM:
DIGITAL SCREENING BILATERAL MAMMOGRAM WITH TOMO AND CAD

[L CC synth-2D (1 of 2)]
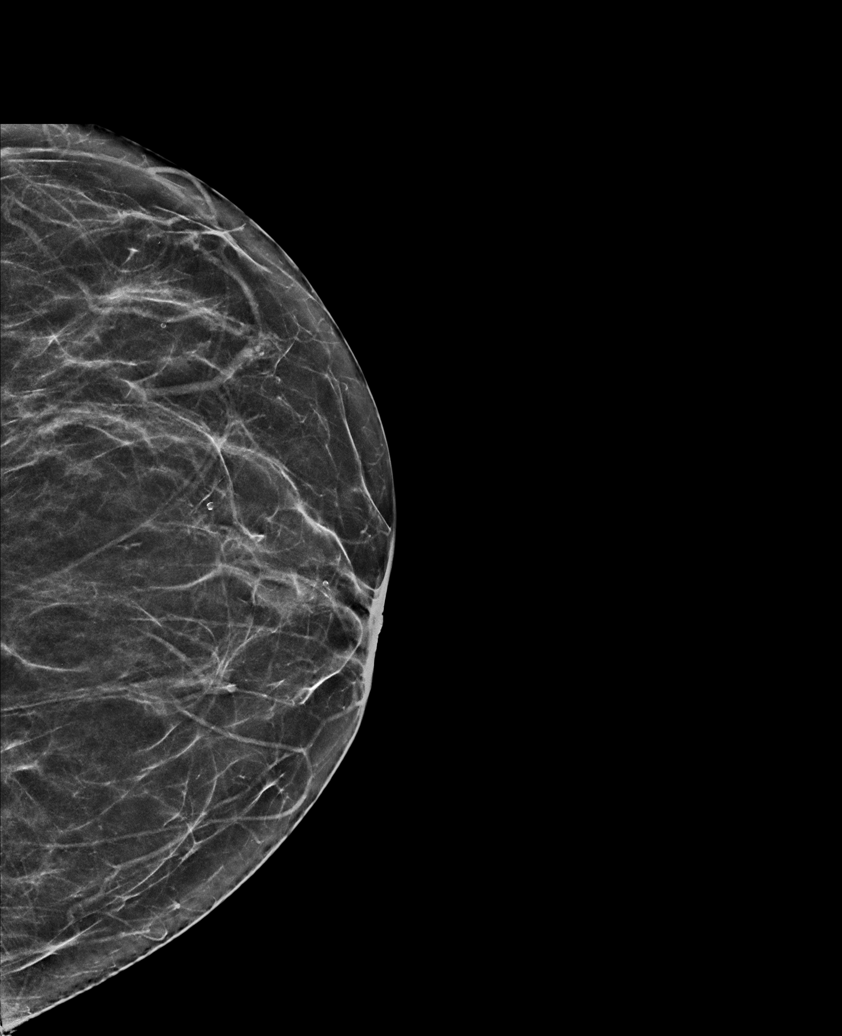

[R CC synth-2D (1 of 2)]
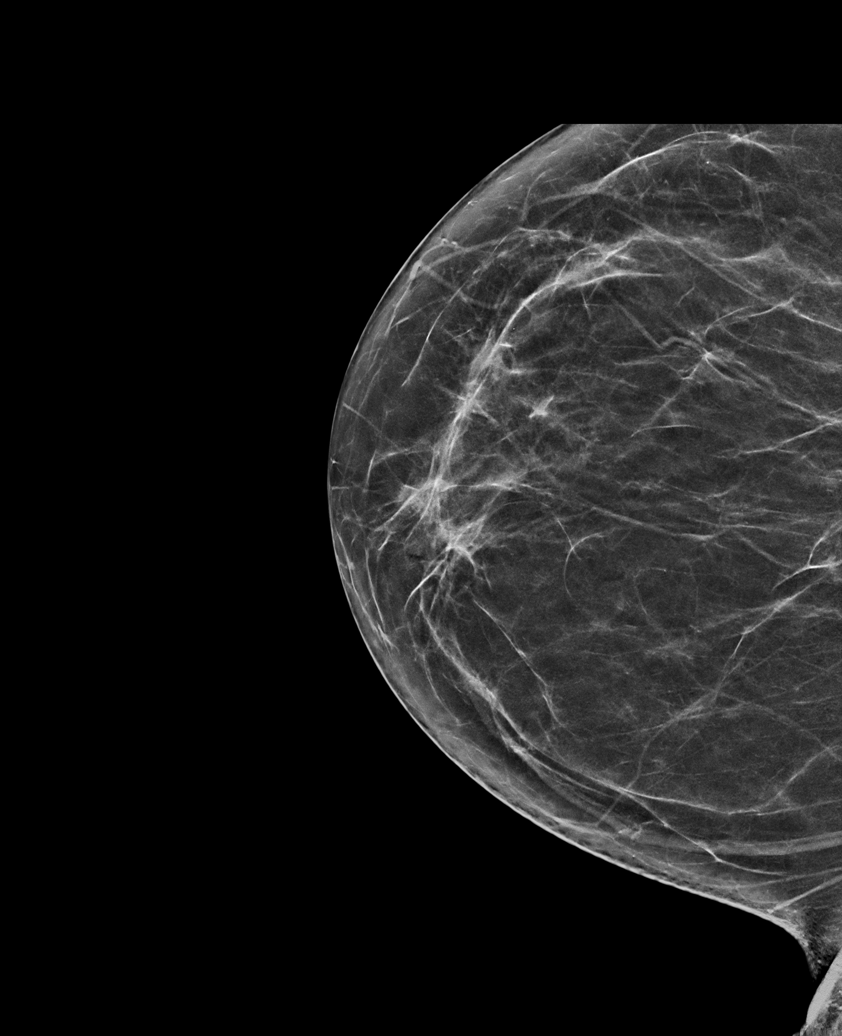

[L MLO synth-2D]
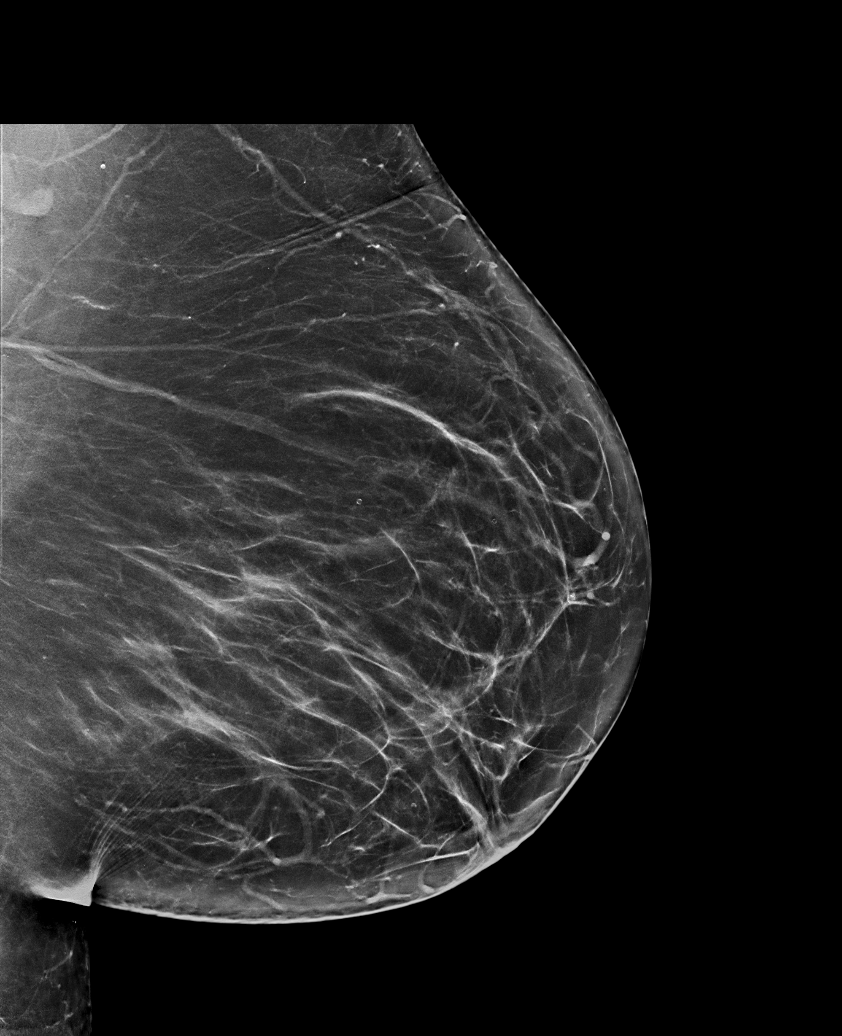

[R CC synth-2D (2 of 2)]
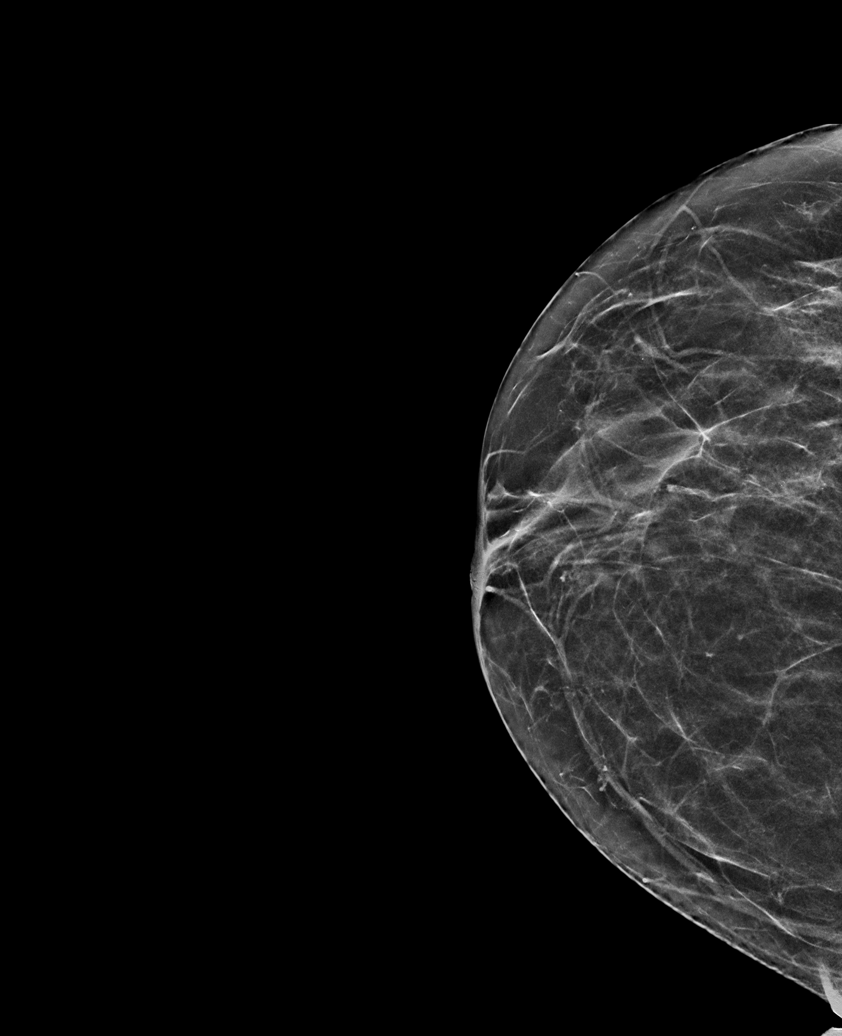

[L CC synth-2D (2 of 2)]
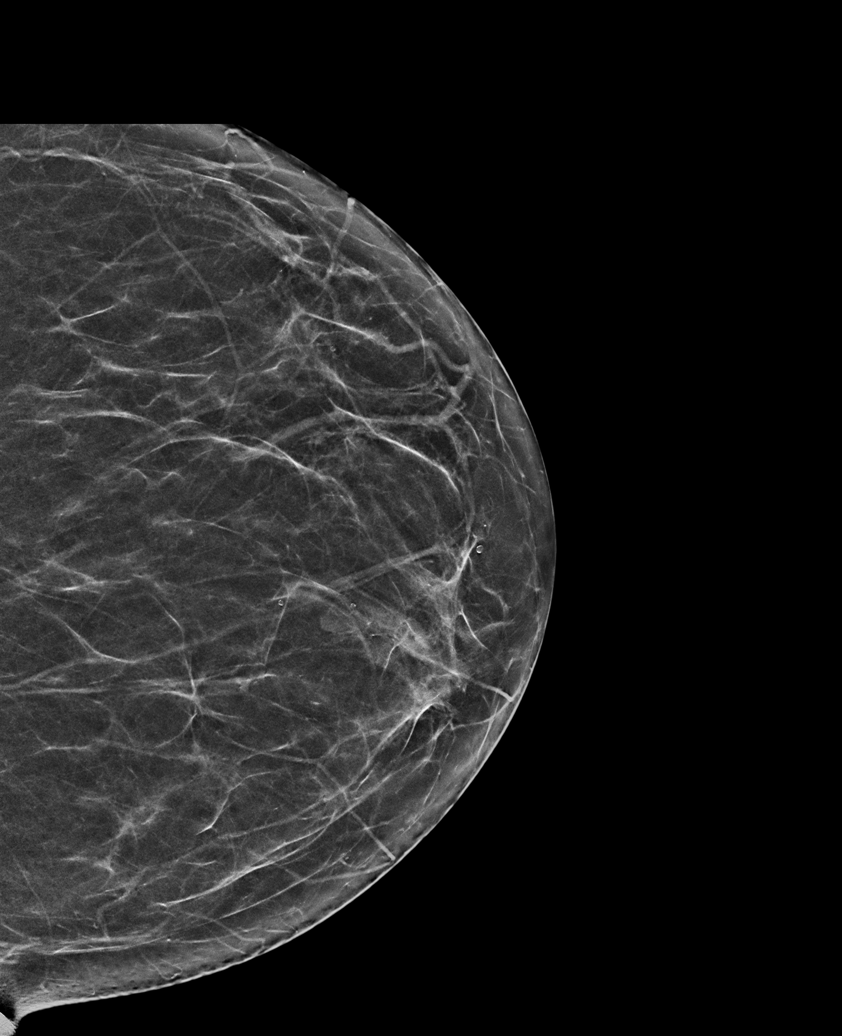

[R MLO synth-2D]
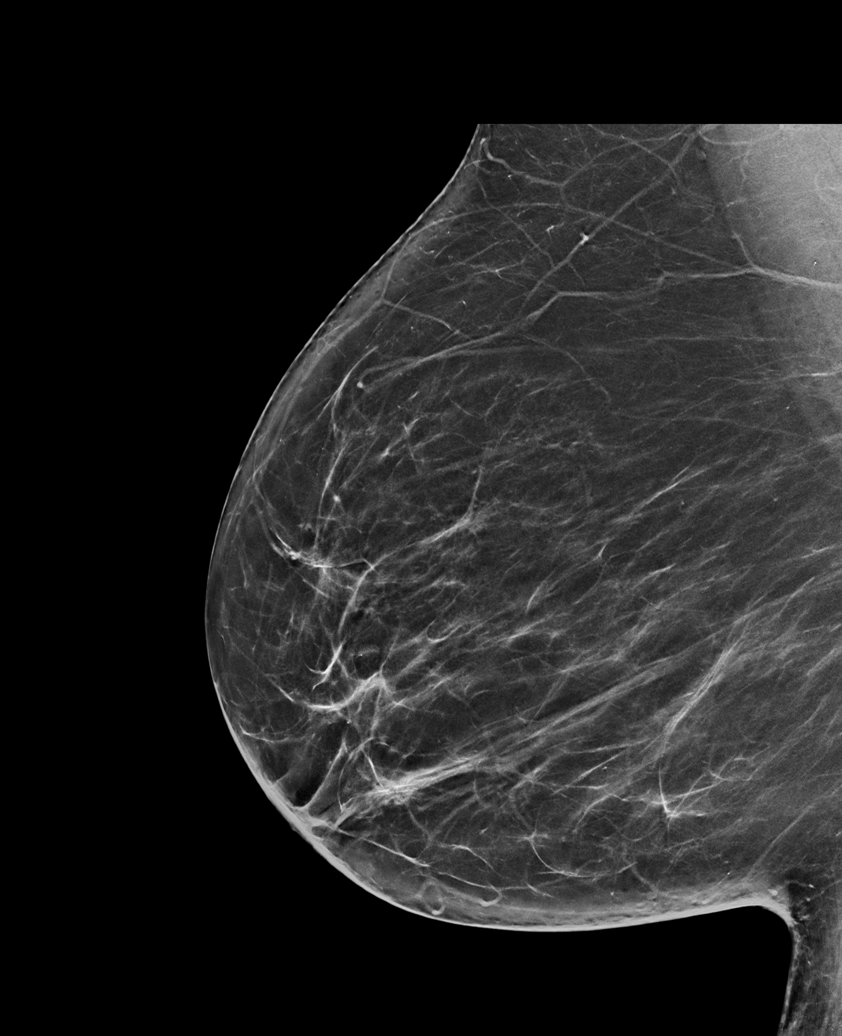

[6 of 36 positions shown; findings below may reference images not displayed]

ACR Breast Density Category b: There are scattered areas of
fibroglandular density.
FINDINGS: There are no findings suspicious for malignancy. Images were
processed with CAD.
IMPRESSION: No mammographic evidence of malignancy. A result letter of this
screening mammogram will be mailed directly to the patient.

RECOMMENDATION:
Screening mammogram in one year. (Code:[id number])

BI-RADS CATEGORY  1: Negative.

## 2021-01-04 ENCOUNTER — Other Ambulatory Visit: Payer: Self-pay | Admitting: Internal Medicine

## 2021-01-04 DIAGNOSIS — Z1231 Encounter for screening mammogram for malignant neoplasm of breast: Secondary | ICD-10-CM

## 2021-01-19 ENCOUNTER — Ambulatory Visit
Admission: RE | Admit: 2021-01-19 | Discharge: 2021-01-19 | Disposition: A | Discharge status: Home / Self Care | Payer: Medicare Other | Source: Ambulatory Visit | Attending: Internal Medicine | Admitting: Internal Medicine

## 2021-01-19 ENCOUNTER — Other Ambulatory Visit: Payer: Self-pay

## 2021-01-19 DIAGNOSIS — Z1231 Encounter for screening mammogram for malignant neoplasm of breast: Secondary | ICD-10-CM | POA: Insufficient documentation

## 2021-12-08 ENCOUNTER — Other Ambulatory Visit: Payer: Self-pay | Admitting: Gerontology

## 2021-12-08 DIAGNOSIS — Z1231 Encounter for screening mammogram for malignant neoplasm of breast: Secondary | ICD-10-CM

## 2022-03-03 ENCOUNTER — Ambulatory Visit
Admission: RE | Admit: 2022-03-03 | Discharge: 2022-03-03 | Disposition: A | Discharge status: Home / Self Care | Payer: Medicare Other | Source: Ambulatory Visit | Attending: Gerontology | Admitting: Gerontology

## 2022-03-03 DIAGNOSIS — Z1231 Encounter for screening mammogram for malignant neoplasm of breast: Secondary | ICD-10-CM | POA: Insufficient documentation

## 2022-03-14 ENCOUNTER — Other Ambulatory Visit: Payer: Self-pay | Admitting: Gerontology

## 2022-03-14 DIAGNOSIS — R928 Other abnormal and inconclusive findings on diagnostic imaging of breast: Secondary | ICD-10-CM

## 2022-03-28 ENCOUNTER — Ambulatory Visit
Admission: RE | Admit: 2022-03-28 | Discharge: 2022-03-28 | Disposition: A | Discharge status: Home / Self Care | Payer: Medicare Other | Source: Ambulatory Visit | Attending: Gerontology | Admitting: Gerontology

## 2022-03-28 DIAGNOSIS — R928 Other abnormal and inconclusive findings on diagnostic imaging of breast: Secondary | ICD-10-CM

## 2022-08-05 ENCOUNTER — Encounter: Payer: Self-pay | Admitting: Ophthalmology

## 2022-08-11 NOTE — Discharge Instructions (Signed)

## 2022-08-15 ENCOUNTER — Other Ambulatory Visit: Payer: Self-pay

## 2022-08-15 ENCOUNTER — Encounter
Admission: RE | Disposition: A | Discharge status: Home / Self Care | Payer: Self-pay | Source: Ambulatory Visit | Attending: Ophthalmology

## 2022-08-15 ENCOUNTER — Ambulatory Visit: Payer: Medicare Other | Admitting: Anesthesiology

## 2022-08-15 ENCOUNTER — Ambulatory Visit
Admission: RE | Admit: 2022-08-15 | Discharge: 2022-08-15 | Disposition: A | Discharge status: Home / Self Care | Payer: Medicare Other | Source: Ambulatory Visit | Attending: Ophthalmology | Admitting: Ophthalmology

## 2022-08-15 ENCOUNTER — Encounter: Payer: Self-pay | Admitting: Ophthalmology

## 2022-08-15 ENCOUNTER — Ambulatory Visit (AMBULATORY_SURGERY_CENTER): Payer: Medicare Other | Admitting: Anesthesiology

## 2022-08-15 DIAGNOSIS — F17210 Nicotine dependence, cigarettes, uncomplicated: Secondary | ICD-10-CM | POA: Diagnosis not present

## 2022-08-15 DIAGNOSIS — H25011 Cortical age-related cataract, right eye: Secondary | ICD-10-CM | POA: Diagnosis not present

## 2022-08-15 DIAGNOSIS — E119 Type 2 diabetes mellitus without complications: Secondary | ICD-10-CM

## 2022-08-15 DIAGNOSIS — H2511 Age-related nuclear cataract, right eye: Secondary | ICD-10-CM | POA: Insufficient documentation

## 2022-08-15 DIAGNOSIS — I10 Essential (primary) hypertension: Secondary | ICD-10-CM | POA: Diagnosis not present

## 2022-08-15 HISTORY — DX: Unspecified osteoarthritis, unspecified site: M19.90

## 2022-08-15 HISTORY — PX: CATARACT EXTRACTION W/PHACO: SHX586

## 2022-08-15 SURGERY — PHACOEMULSIFICATION, CATARACT, WITH IOL INSERTION
Anesthesia: Monitor Anesthesia Care | Site: Eye | Laterality: Right

## 2022-08-15 MED ORDER — LIDOCAINE HCL (PF) 2 % IJ SOLN
INTRAOCULAR | Status: DC | PRN
  Administered 2022-08-15: 1 mL via INTRAOCULAR

## 2022-08-15 MED ORDER — SIGHTPATH DOSE#1 BSS IO SOLN
INTRAOCULAR | Status: DC | PRN
  Administered 2022-08-15: 15 mL

## 2022-08-15 MED ORDER — MIDAZOLAM HCL 2 MG/2ML IJ SOLN
INTRAMUSCULAR | Status: DC | PRN
  Administered 2022-08-15: 1 mg via INTRAVENOUS

## 2022-08-15 MED ORDER — FENTANYL CITRATE (PF) 100 MCG/2ML IJ SOLN
INTRAMUSCULAR | Status: DC | PRN
  Administered 2022-08-15: 50 ug via INTRAVENOUS

## 2022-08-15 MED ORDER — SIGHTPATH DOSE#1 BSS IO SOLN
INTRAOCULAR | Status: DC | PRN
  Administered 2022-08-15: 88 mL via OPHTHALMIC

## 2022-08-15 MED ORDER — SIGHTPATH DOSE#1 NA HYALUR & NA CHOND-NA HYALUR IO KIT
PACK | INTRAOCULAR | Status: DC | PRN
  Administered 2022-08-15: 1 via OPHTHALMIC

## 2022-08-15 MED ORDER — ARMC OPHTHALMIC DILATING DROPS
1.0000 | OPHTHALMIC | Status: AC | PRN
  Administered 2022-08-15 (×3): 1 via OPHTHALMIC

## 2022-08-15 MED ORDER — TETRACAINE HCL 0.5 % OP SOLN
1.0000 [drp] | OPHTHALMIC | Status: AC | PRN
  Administered 2022-08-15 (×3): 1 [drp] via OPHTHALMIC

## 2022-08-15 MED ORDER — MOXIFLOXACIN HCL 0.5 % OP SOLN
OPHTHALMIC | Status: DC | PRN
  Administered 2022-08-15: 0.2 mL via OPHTHALMIC

## 2022-08-15 MED ORDER — LACTATED RINGERS IV SOLN: INTRAVENOUS | Status: DC

## 2022-08-15 SURGICAL SUPPLY — 14 items
CATARACT SUITE SIGHTPATH (MISCELLANEOUS) ×1 IMPLANT
DISSECTOR HYDRO NUCLEUS 50X22 (MISCELLANEOUS) ×1 IMPLANT
FEE CATARACT SUITE SIGHTPATH (MISCELLANEOUS) ×1 IMPLANT
GLOVE SURG GAMMEX PI TX LF 7.5 (GLOVE) ×1 IMPLANT
GLOVE SURG SYN 8.5  E (GLOVE) ×1
GLOVE SURG SYN 8.5 E (GLOVE) ×1 IMPLANT
GLOVE SURG SYN 8.5 PF PI (GLOVE) ×1 IMPLANT
LENS IOL TECNIS EYHANCE 21.5 (Intraocular Lens) IMPLANT
NDL FILTER BLUNT 18X1 1/2 (NEEDLE) ×1 IMPLANT
NEEDLE FILTER BLUNT 18X 1/2SAF (NEEDLE) ×1
NEEDLE FILTER BLUNT 18X1 1/2 (NEEDLE) ×1 IMPLANT
SYR 3ML LL SCALE MARK (SYRINGE) ×1 IMPLANT
SYR 5ML LL (SYRINGE) ×1 IMPLANT
WATER STERILE IRR 250ML POUR (IV SOLUTION) ×1 IMPLANT

## 2022-08-15 NOTE — Transfer of Care (Signed)
Immediate Anesthesia Transfer of Care Note  Patient: Annette Wells  Procedure(s) Performed: CATARACT EXTRACTION PHACO AND INTRAOCULAR LENS PLACEMENT (IOC) RIGHT DIABETIC 2.47 00:25.9 (Right: Eye)  Patient Location: PACU  Anesthesia Type: MAC  Level of Consciousness: awake, alert  and patient cooperative  Airway and Oxygen Therapy: Patient Spontanous Breathing and Patient connected to supplemental oxygen  Post-op Assessment: Post-op Vital signs reviewed, Patient's Cardiovascular Status Stable, Respiratory Function Stable, Patent Airway and No signs of Nausea or vomiting  Post-op Vital Signs: Reviewed and stable  Complications: No notable events documented.

## 2022-08-15 NOTE — Anesthesia Preprocedure Evaluation (Signed)
Anesthesia Evaluation  Patient identified by MRN, date of birth, ID band Patient awake    Reviewed: Allergy & Precautions, H&P , NPO status , Patient's Chart, lab work & pertinent test results, reviewed documented beta blocker date and time   Airway Mallampati: II  TM Distance: >3 FB Neck ROM: full    Dental no notable dental hx. (+) Teeth Intact   Pulmonary neg pulmonary ROS, Current Smoker and Patient abstained from smoking.,    Pulmonary exam normal breath sounds clear to auscultation       Cardiovascular Exercise Tolerance: Poor hypertension, On Medications negative cardio ROS   Rhythm:regular Rate:Normal     Neuro/Psych TIACVA negative psych ROS   GI/Hepatic Neg liver ROS, GERD  Medicated,  Endo/Other  negative endocrine ROSdiabetes  Renal/GU      Musculoskeletal   Abdominal   Peds  Hematology negative hematology ROS (+)   Anesthesia Other Findings   Reproductive/Obstetrics negative OB ROS                             Anesthesia Physical Anesthesia Plan  ASA: 3  Anesthesia Plan: MAC   Post-op Pain Management:    Induction:   PONV Risk Score and Plan:   Airway Management Planned:   Additional Equipment:   Intra-op Plan:   Post-operative Plan:   Informed Consent: I have reviewed the patients History and Physical, chart, labs and discussed the procedure including the risks, benefits and alternatives for the proposed anesthesia with the patient or authorized representative who has indicated his/her understanding and acceptance.       Plan Discussed with: CRNA  Anesthesia Plan Comments:         Anesthesia Quick Evaluation

## 2022-08-15 NOTE — Op Note (Signed)
OPERATIVE NOTE  Annette Wells [id number] 08/15/2022   PREOPERATIVE DIAGNOSIS:  Nuclear sclerotic cataract right eye.  H25.11   POSTOPERATIVE DIAGNOSIS:    Nuclear sclerotic cataract right eye.     PROCEDURE:  Phacoemusification with posterior chamber intraocular lens placement of the right eye   LENS:   Implant Name Type Inv. Item Serial No. Manufacturer Lot No. LRB No. Used Action  LENS IOL TECNIS EYHANCE 21.5 - [id number] Intraocular Lens LENS IOL TECNIS EYHANCE 21.5 [id number] [employer]  Right 1 Implanted       Procedure(s): CATARACT EXTRACTION PHACO AND INTRAOCULAR LENS PLACEMENT (IOC) RIGHT DIABETIC 2.47 00:25.9 (Right)  DIB00 +21.5   ULTRASOUND TIME: 0 minutes 25 seconds.  CDE 2.47   SURGEON:  Willey Blade, MD, MPH  ANESTHESIOLOGIST: CRNA: Andee Poles, CRNA   ANESTHESIA:  Topical with tetracaine drops augmented with 1% preservative-free intracameral lidocaine.  ESTIMATED BLOOD LOSS: less than 1 mL.   COMPLICATIONS:  None.   DESCRIPTION OF PROCEDURE:  The patient was identified in the holding room and transported to the operating room and placed in the supine position under the operating microscope.  The right eye was identified as the operative eye and it was prepped and draped in the usual sterile ophthalmic fashion.   A 1.0 millimeter clear-corneal paracentesis was made at the 10:30 position. 0.5 ml of preservative-free 1% lidocaine with epinephrine was injected into the anterior chamber.  The anterior chamber was filled with viscoelastic.  A 2.4 millimeter keratome was used to make a near-clear corneal incision at the 8:00 position.  A curvilinear capsulorrhexis was made with a cystotome and capsulorrhexis forceps.  Balanced salt solution was used to hydrodissect and hydrodelineate the nucleus.   Phacoemulsification was then used in stop and chop fashion to remove the lens nucleus and epinucleus.  The remaining cortex was then removed using the irrigation and  aspiration handpiece. Viscoelastic was then placed into the capsular bag to distend it for lens placement.  A lens was then injected into the capsular bag.  The remaining viscoelastic was aspirated.   Wounds were hydrated with balanced salt solution.  The anterior chamber was inflated to a physiologic pressure with balanced salt solution.   Intracameral vigamox 0.1 mL undiluted was injected into the eye and a drop placed onto the ocular surface.  No wound leaks were noted.  The patient was taken to the recovery room in stable condition without complications of anesthesia or surgery  [name] 08/15/2022, 7:56 AM

## 2022-08-15 NOTE — Anesthesia Postprocedure Evaluation (Signed)
Anesthesia Post Note  Patient: Annette Wells  Procedure(s) Performed: CATARACT EXTRACTION PHACO AND INTRAOCULAR LENS PLACEMENT (IOC) RIGHT DIABETIC 2.47 00:25.9 (Right: Eye)     Patient location during evaluation: PACU Anesthesia Type: MAC Level of consciousness: awake and alert Pain management: pain level controlled Vital Signs Assessment: post-procedure vital signs reviewed and stable Respiratory status: spontaneous breathing, nonlabored ventilation, respiratory function stable and patient connected to nasal cannula oxygen Cardiovascular status: blood pressure returned to baseline and stable Postop Assessment: no apparent nausea or vomiting Anesthetic complications: no   No notable events documented.  Molli Barrows

## 2022-08-15 NOTE — H&P (Signed)
[facility]   Primary Care Physician:  Luciana Axe, NP Ophthalmologist: Dr. Willey Blade  Pre-Procedure History & Physical: HPI:  Annette Wells is a 76 y.o. female here for cataract surgery.   Past Medical History:  Diagnosis Date   Arthritis    DDD (degenerative disc disease), lumbar    GERD (gastroesophageal reflux disease)    Glucose intolerance (impaired glucose tolerance)    prediabetic. watching her diet   High cholesterol    Hypertension    Osteomyelitis (HCC)    Osteopenia    Stroke (HCC) 2018   TIA.  has been on plavix since then    Past Surgical History:  Procedure Laterality Date   APPENDECTOMY     CARPAL TUNNEL RELEASE Left 01/09/2019   Procedure: CARPAL TUNNEL RELEASE;  Surgeon: Erin Sons, MD;  Location: [facility] ORS;  Service: Orthopedics;  Laterality: Left;   COLONOSCOPY WITH ESOPHAGOGASTRODUODENOSCOPY (EGD)     COLONOSCOPY WITH PROPOFOL N/A 11/11/2016   Procedure: COLONOSCOPY WITH PROPOFOL;  Surgeon: Scot Jun, MD;  Location: [facility] ENDOSCOPY;  Service: Endoscopy;  Laterality: N/A;   mandibular osteomyelitis     TUBAL LIGATION      Prior to Admission medications   Medication Sig Start Date End Date Taking? Authorizing Provider  acetaminophen (TYLENOL) 325 MG tablet Take 650 mg by mouth every 6 (six) hours as needed for moderate pain.   Yes [provider]  atorvastatin (LIPITOR) 40 MG tablet Take 1 tablet (40 mg total) by mouth daily at 6 PM. 08/24/17  Yes Delfino Lovett, MD  calcium-vitamin D (OSCAL WITH D) 500-200 MG-UNIT tablet Take 1 tablet by mouth 2 (two) times daily.   Yes [provider]  clopidogrel (PLAVIX) 75 MG tablet Take 75 mg by mouth daily.   Yes [provider]  diltiazem (TIAZAC) 360 MG 24 hr capsule Take 360 mg by mouth daily. 08/14/17  Yes [provider]  ECHINACEA COMPLEX PO Take 1 tablet by mouth daily.   Yes [provider]  ELDERBERRY PO Take 1 tablet by mouth daily.   Yes  [provider]  gabapentin (NEURONTIN) 100 MG capsule Take 100 mg by mouth 3 (three) times daily.   Yes [provider]  hydrochlorothiazide (HYDRODIURIL) 25 MG tablet Take 25 mg by mouth daily.   Yes [provider]  lactobacillus acidophilus (BACID) TABS tablet Take 1 tablet by mouth daily.   Yes [provider]  Magnesium 250 MG TABS Take 250 mg by mouth daily.   Yes [provider]  Multiple Vitamin (MULTIVITAMIN WITH MINERALS) TABS tablet Take 1 tablet by mouth daily.   Yes [provider]  Omega-3 Fatty Acids (OMEGA III EPA+DHA PO) Take 1 capsule by mouth daily.   Yes [provider]  telmisartan (MICARDIS) 80 MG tablet Take 80 mg by mouth daily.   Yes [provider]  vitamin E 400 UNIT capsule Take 400 Units by mouth daily.   Yes [provider]  aspirin 81 MG tablet Take 81 mg by mouth daily.  Patient not taking: Reported on 08/05/2022    [provider]  HYDROcodone-acetaminophen (NORCO/VICODIN) 5-325 MG tablet Take 1 tablet by mouth every 6 (six) hours as needed for moderate pain. Patient not taking: Reported on 08/05/2022 03/15/20   Tommi Rumps, PA-C  losartan (COZAAR) 100 MG tablet Take 100 mg by mouth daily. Patient not taking: Reported on 08/05/2022    [provider]  Potassium 99 MG TABS  Take 99 mg by mouth daily. Patient not taking: Reported on 08/05/2022    [provider]  predniSONE (DELTASONE) 10 MG tablet Take 6 tablets  today, on day 2 take 5 tablets, day 3 take 4 tablets, day 4 take 3 tablets, day 5 take  2 tablets and 1 tablet the last day Patient not taking: Reported on 08/05/2022 03/15/20   Tommi Rumps, PA-C  tiZANidine (ZANAFLEX) 2 MG tablet Take 2 mg by mouth at bedtime as needed for muscle spasms.  Patient not taking: Reported on 08/05/2022 08/22/17   [provider]    Allergies as of 07/12/2022 - Review Complete 03/15/2020  Allergen Reaction Noted    Influenza virus vaccine Other (See Comments) 07/30/2014   Tramadol Other (See Comments) 01/01/2019    Family History  Problem Relation Age of Onset   Diabetes Mother    Hypertension Mother    Diabetes Father    Cirrhosis Father    Leukemia Father    Diabetes Sister    Stroke Sister    Chronic Renal Failure Sister    Breast cancer Neg Hx     Social History   Socioeconomic History   Marital status: Widowed    Spouse name: Fayrene Fearing   Number of children: 2   Years of education: Not on file   Highest education level: Not on file  Occupational History   Occupation: little bit of everything!!    Comment: retired  Tobacco Use   Smoking status: Every Day    Packs/day: 0.50    Years: 15.00    Total pack years: 7.50    Types: Cigarettes   Smokeless tobacco: Never   Tobacco comments:    future plan  Vaping Use   Vaping Use: Never used  Substance and Sexual Activity   Alcohol use: Yes    Alcohol/week: 1.0 standard drink of alcohol    Types: 1 Glasses of wine per week    Comment: very little   Drug use: No   Sexual activity: Never  Other Topics Concern   Not on file  Social History Narrative   Not on file   Social Determinants of Health   Financial Resource Strain: Not on file  Food Insecurity: Not on file  Transportation Needs: Not on file  Physical Activity: Not on file  Stress: Not on file  Social Connections: Not on file  Intimate Partner Violence: Not on file    Review of Systems: See HPI, otherwise negative ROS  Physical Exam: BP (!) 159/80   Pulse 71   Temp 98.4 F (36.9 C) (Temporal)   Ht 5\' 1"  (1.549 m)   Wt 97.5 kg   SpO2 96%   BMI 40.62 kg/m  General:   Alert, cooperative in NAD Head:  Normocephalic and atraumatic. Respiratory:  Normal work of breathing. Cardiovascular:  RRR  Impression/Plan: Annette Wells is here for cataract surgery.  Risks, benefits, limitations, and alternatives regarding cataract surgery have been reviewed with the  patient.  Questions have been answered.  All parties agreeable.   Mervin Hack, MD  08/15/2022, 7:12 AM

## 2022-08-16 ENCOUNTER — Encounter: Payer: Self-pay | Admitting: Ophthalmology

## 2022-08-24 NOTE — Discharge Instructions (Signed)

## 2022-08-26 NOTE — Anesthesia Preprocedure Evaluation (Signed)
Anesthesia Evaluation  Patient identified by MRN, date of birth, ID band Patient awake    Reviewed: Allergy & Precautions, H&P , NPO status , Patient's Chart, lab work & pertinent test results, reviewed documented beta blocker date and time   Airway Mallampati: III  TM Distance: >3 FB Neck ROM: full    Dental  (+) Partial Upper, Partial Lower   Pulmonary Current Smoker and Patient abstained from smoking.,    Pulmonary exam normal breath sounds clear to auscultation       Cardiovascular Exercise Tolerance: Poor hypertension, On Medications  Rhythm:regular Rate:Normal     Neuro/Psych TIAnegative psych ROS   GI/Hepatic negative GI ROS, Neg liver ROS,   Endo/Other  negative endocrine ROS  Renal/GU      Musculoskeletal   Abdominal (+) + obese,   Peds  Hematology negative hematology ROS (+)   Anesthesia Other Findings   Reproductive/Obstetrics negative OB ROS                            Anesthesia Physical  Anesthesia Plan  ASA: 3  Anesthesia Plan: MAC   Post-op Pain Management:    Induction:   PONV Risk Score and Plan:   Airway Management Planned:   Additional Equipment:   Intra-op Plan:   Post-operative Plan:   Informed Consent: I have reviewed the patients History and Physical, chart, labs and discussed the procedure including the risks, benefits and alternatives for the proposed anesthesia with the patient or authorized representative who has indicated his/her understanding and acceptance.       Plan Discussed with: CRNA  Anesthesia Plan Comments:        Anesthesia Quick Evaluation

## 2022-08-29 ENCOUNTER — Encounter
Admission: RE | Disposition: A | Discharge status: Home / Self Care | Payer: Self-pay | Source: Home / Self Care | Attending: Ophthalmology

## 2022-08-29 ENCOUNTER — Ambulatory Visit (AMBULATORY_SURGERY_CENTER): Payer: Medicare Other | Admitting: Anesthesiology

## 2022-08-29 ENCOUNTER — Other Ambulatory Visit: Payer: Self-pay

## 2022-08-29 ENCOUNTER — Ambulatory Visit
Admission: RE | Admit: 2022-08-29 | Discharge: 2022-08-29 | Disposition: A | Discharge status: Home / Self Care | Payer: Medicare Other | Attending: Ophthalmology | Admitting: Ophthalmology

## 2022-08-29 ENCOUNTER — Encounter: Payer: Self-pay | Admitting: Ophthalmology

## 2022-08-29 ENCOUNTER — Ambulatory Visit: Payer: Medicare Other | Admitting: Anesthesiology

## 2022-08-29 DIAGNOSIS — E1136 Type 2 diabetes mellitus with diabetic cataract: Secondary | ICD-10-CM | POA: Insufficient documentation

## 2022-08-29 DIAGNOSIS — H2512 Age-related nuclear cataract, left eye: Secondary | ICD-10-CM | POA: Diagnosis not present

## 2022-08-29 DIAGNOSIS — E119 Type 2 diabetes mellitus without complications: Secondary | ICD-10-CM

## 2022-08-29 DIAGNOSIS — I10 Essential (primary) hypertension: Secondary | ICD-10-CM

## 2022-08-29 DIAGNOSIS — E669 Obesity, unspecified: Secondary | ICD-10-CM | POA: Insufficient documentation

## 2022-08-29 DIAGNOSIS — F17210 Nicotine dependence, cigarettes, uncomplicated: Secondary | ICD-10-CM | POA: Diagnosis not present

## 2022-08-29 DIAGNOSIS — Z6839 Body mass index (BMI) 39.0-39.9, adult: Secondary | ICD-10-CM | POA: Diagnosis not present

## 2022-08-29 HISTORY — PX: CATARACT EXTRACTION W/PHACO: SHX586

## 2022-08-29 SURGERY — PHACOEMULSIFICATION, CATARACT, WITH IOL INSERTION
Anesthesia: Monitor Anesthesia Care | Site: Eye | Laterality: Left

## 2022-08-29 MED ORDER — SIGHTPATH DOSE#1 BSS IO SOLN
INTRAOCULAR | Status: DC | PRN
  Administered 2022-08-29: 87 mL via OPHTHALMIC

## 2022-08-29 MED ORDER — SIGHTPATH DOSE#1 NA HYALUR & NA CHOND-NA HYALUR IO KIT
PACK | INTRAOCULAR | Status: DC | PRN
  Administered 2022-08-29: 1 via OPHTHALMIC

## 2022-08-29 MED ORDER — LIDOCAINE HCL (PF) 2 % IJ SOLN
INTRAOCULAR | Status: DC | PRN
  Administered 2022-08-29: 1 mL via INTRAOCULAR

## 2022-08-29 MED ORDER — FENTANYL CITRATE (PF) 100 MCG/2ML IJ SOLN
INTRAMUSCULAR | Status: DC | PRN
  Administered 2022-08-29: 100 ug via INTRAVENOUS

## 2022-08-29 MED ORDER — ARMC OPHTHALMIC DILATING DROPS
1.0000 | OPHTHALMIC | Status: DC | PRN
  Administered 2022-08-29 (×3): 1 via OPHTHALMIC

## 2022-08-29 MED ORDER — TETRACAINE HCL 0.5 % OP SOLN
1.0000 [drp] | OPHTHALMIC | Status: DC | PRN
  Administered 2022-08-29 (×3): 1 [drp] via OPHTHALMIC

## 2022-08-29 MED ORDER — MOXIFLOXACIN HCL 0.5 % OP SOLN
OPHTHALMIC | Status: DC | PRN
  Administered 2022-08-29: 0.2 mL via OPHTHALMIC

## 2022-08-29 MED ORDER — SIGHTPATH DOSE#1 BSS IO SOLN
INTRAOCULAR | Status: DC | PRN
  Administered 2022-08-29: 15 mL

## 2022-08-29 MED ORDER — MIDAZOLAM HCL 2 MG/2ML IJ SOLN
INTRAMUSCULAR | Status: DC | PRN
  Administered 2022-08-29: 2 mg via INTRAVENOUS

## 2022-08-29 SURGICAL SUPPLY — 13 items
CATARACT SUITE SIGHTPATH (MISCELLANEOUS) ×1 IMPLANT
DISSECTOR HYDRO NUCLEUS 50X22 (MISCELLANEOUS) ×1 IMPLANT
FEE CATARACT SUITE SIGHTPATH (MISCELLANEOUS) ×1 IMPLANT
GLOVE SURG GAMMEX PI TX LF 7.5 (GLOVE) ×1 IMPLANT
GLOVE SURG SYN 8.5  E (GLOVE) ×1
GLOVE SURG SYN 8.5 E (GLOVE) ×1 IMPLANT
GLOVE SURG SYN 8.5 PF PI (GLOVE) ×1 IMPLANT
LENS IOL TECNIS EYHANCE 22.0 (Intraocular Lens) IMPLANT
NDL FILTER BLUNT 18X1 1/2 (NEEDLE) ×1 IMPLANT
NEEDLE FILTER BLUNT 18X1 1/2 (NEEDLE) ×1 IMPLANT
SYR 3ML LL SCALE MARK (SYRINGE) ×1 IMPLANT
SYR 5ML LL (SYRINGE) ×1 IMPLANT
WATER STERILE IRR 250ML POUR (IV SOLUTION) ×1 IMPLANT

## 2022-08-29 NOTE — Transfer of Care (Signed)
Immediate Anesthesia Transfer of Care Note  Patient: Annette Wells  Procedure(s) Performed: CATARACT EXTRACTION PHACO AND INTRAOCULAR LENS PLACEMENT (IOC) LEFT DIABETIC 2.65 00:20.7 (Left: Eye)  Patient Location: PACU  Anesthesia Type: MAC  Level of Consciousness: awake, alert  and patient cooperative  Airway and Oxygen Therapy: Patient Spontanous Breathing and Patient connected to supplemental oxygen  Post-op Assessment: Post-op Vital signs reviewed, Patient's Cardiovascular Status Stable, Respiratory Function Stable, Patent Airway and No signs of Nausea or vomiting  Post-op Vital Signs: Reviewed and stable  Complications: No notable events documented.

## 2022-08-29 NOTE — Op Note (Signed)
OPERATIVE NOTE  CAMI DELAWDER [id number] 08/29/2022   PREOPERATIVE DIAGNOSIS:  Nuclear sclerotic cataract left eye.  H25.12   POSTOPERATIVE DIAGNOSIS:    Nuclear sclerotic cataract left eye.     PROCEDURE:  Phacoemusification with posterior chamber intraocular lens placement of the left eye   LENS:   Implant Name Type Inv. Item Serial No. Manufacturer Lot No. LRB No. Used Action  LENS IOL TECNIS EYHANCE 22.0 - [id number] Intraocular Lens LENS IOL TECNIS EYHANCE 22.0 [id number] [employer]  Left 1 Implanted      Procedure(s): CATARACT EXTRACTION PHACO AND INTRAOCULAR LENS PLACEMENT (IOC) LEFT DIABETIC 2.65 00:20.7 (Left)  DIB00 +22.0   ULTRASOUND TIME: 0 minutes 20 seconds.  CDE 2.65   SURGEON:  Benay Pillow, MD, MPH   ANESTHESIA:  Topical with tetracaine drops augmented with 1% preservative-free intracameral lidocaine.  ESTIMATED BLOOD LOSS: <1 mL   COMPLICATIONS:  None.   DESCRIPTION OF PROCEDURE:  The patient was identified in the holding room and transported to the operating room and placed in the supine position under the operating microscope.  The left eye was identified as the operative eye and it was prepped and draped in the usual sterile ophthalmic fashion.   A 1.0 millimeter clear-corneal paracentesis was made at the 5:00 position. 0.5 ml of preservative-free 1% lidocaine with epinephrine was injected into the anterior chamber.  The anterior chamber was filled with viscoelastic.  A 2.4 millimeter keratome was used to make a near-clear corneal incision at the 2:00 position.  A curvilinear capsulorrhexis was made with a cystotome and capsulorrhexis forceps.  Balanced salt solution was used to hydrodissect and hydrodelineate the nucleus.   Phacoemulsification was then used in stop and chop fashion to remove the lens nucleus and epinucleus.  The remaining cortex was then removed using the irrigation and aspiration handpiece. Viscoelastic was then placed into the capsular bag  to distend it for lens placement.  A lens was then injected into the capsular bag.  The remaining viscoelastic was aspirated.   Wounds were hydrated with balanced salt solution.  The anterior chamber was inflated to a physiologic pressure with balanced salt solution.  Intracameral vigamox 0.1 mL undiltued was injected into the eye and a drop placed onto the ocular surface.  No wound leaks were noted.  The patient was taken to the recovery room in stable condition without complications of anesthesia or surgery  Rakhi Romagnoli 08/29/2022, 7:44 AM

## 2022-08-29 NOTE — Anesthesia Postprocedure Evaluation (Signed)
Anesthesia Post Note  Patient: PHYLLISTINE DOMINGOS  Procedure(s) Performed: CATARACT EXTRACTION PHACO AND INTRAOCULAR LENS PLACEMENT (IOC) LEFT DIABETIC 2.65 00:20.7 (Left: Eye)     Patient location during evaluation: PACU Anesthesia Type: MAC Level of consciousness: awake and alert Pain management: pain level controlled Vital Signs Assessment: post-procedure vital signs reviewed and stable Respiratory status: spontaneous breathing, nonlabored ventilation and respiratory function stable Cardiovascular status: stable and blood pressure returned to baseline Postop Assessment: no apparent nausea or vomiting Anesthetic complications: no   No notable events documented.  [place] Ouch

## 2022-08-29 NOTE — H&P (Signed)
[facility]   Primary Care Physician:  Luciana Axe, NP Ophthalmologist: Dr. Willey Blade  Pre-Procedure History & Physical: HPI:  Annette Wells is a 76 y.o. female here for cataract surgery.   Past Medical History:  Diagnosis Date   Arthritis    DDD (degenerative disc disease), lumbar    GERD (gastroesophageal reflux disease)    Glucose intolerance (impaired glucose tolerance)    prediabetic. watching her diet   High cholesterol    Hypertension    Osteomyelitis (HCC)    Osteopenia    Stroke (HCC) 2018   TIA.  has been on plavix since then    Past Surgical History:  Procedure Laterality Date   APPENDECTOMY     CARPAL TUNNEL RELEASE Left 01/09/2019   Procedure: CARPAL TUNNEL RELEASE;  Surgeon: Erin Sons, MD;  Location: [facility] ORS;  Service: Orthopedics;  Laterality: Left;   CATARACT EXTRACTION W/PHACO Right 08/15/2022   Procedure: CATARACT EXTRACTION PHACO AND INTRAOCULAR LENS PLACEMENT (IOC) RIGHT DIABETIC 2.47 00:25.9;  Surgeon: Nevada Crane, MD;  Location: [facility];  Service: Ophthalmology;  Laterality: Right;   COLONOSCOPY WITH ESOPHAGOGASTRODUODENOSCOPY (EGD)     COLONOSCOPY WITH PROPOFOL N/A 11/11/2016   Procedure: COLONOSCOPY WITH PROPOFOL;  Surgeon: Scot Jun, MD;  Location: [facility] ENDOSCOPY;  Service: Endoscopy;  Laterality: N/A;   mandibular osteomyelitis     TUBAL LIGATION      Prior to Admission medications   Medication Sig Start Date End Date Taking? Authorizing Provider  acetaminophen (TYLENOL) 325 MG tablet Take 650 mg by mouth every 6 (six) hours as needed for moderate pain.   Yes [provider]  atorvastatin (LIPITOR) 40 MG tablet Take 1 tablet (40 mg total) by mouth daily at 6 PM. 08/24/17  Yes Delfino Lovett, MD  calcium-vitamin D (OSCAL WITH D) 500-200 MG-UNIT tablet Take 1 tablet by mouth 2 (two) times daily.   Yes [provider]  clopidogrel (PLAVIX) 75 MG tablet Take 75 mg by mouth daily.   Yes  [provider]  diltiazem (TIAZAC) 360 MG 24 hr capsule Take 360 mg by mouth daily. 08/14/17  Yes [provider]  ECHINACEA COMPLEX PO Take 1 tablet by mouth daily.   Yes [provider]  ELDERBERRY PO Take 1 tablet by mouth daily.   Yes [provider]  gabapentin (NEURONTIN) 100 MG capsule Take 100 mg by mouth 3 (three) times daily.   Yes [provider]  hydrochlorothiazide (HYDRODIURIL) 25 MG tablet Take 25 mg by mouth daily.   Yes [provider]  lactobacillus acidophilus (BACID) TABS tablet Take 1 tablet by mouth daily.   Yes [provider]  Magnesium 250 MG TABS Take 250 mg by mouth daily.   Yes [provider]  Multiple Vitamin (MULTIVITAMIN WITH MINERALS) TABS tablet Take 1 tablet by mouth daily.   Yes [provider]  Omega-3 Fatty Acids (OMEGA III EPA+DHA PO) Take 1 capsule by mouth daily.   Yes [provider]  telmisartan (MICARDIS) 80 MG tablet Take 80 mg by mouth daily.   Yes [provider]  vitamin E 400 UNIT capsule Take 400 Units by mouth daily.   Yes [provider]  aspirin 81 MG tablet Take 81 mg by mouth daily.  Patient not taking: Reported on 08/05/2022    [provider]  HYDROcodone-acetaminophen (NORCO/VICODIN) 5-325 MG tablet Take 1 tablet by mouth every 6 (six) hours as needed for moderate pain. Patient not taking:  Reported on 08/05/2022 03/15/20   Johnn Hai, PA-C  losartan (COZAAR) 100 MG tablet Take 100 mg by mouth daily. Patient not taking: Reported on 08/05/2022    [provider]  Potassium 99 MG TABS Take 99 mg by mouth daily. Patient not taking: Reported on 08/05/2022    [provider]  predniSONE (DELTASONE) 10 MG tablet Take 6 tablets  today, on day 2 take 5 tablets, day 3 take 4 tablets, day 4 take 3 tablets, day 5 take  2 tablets and 1 tablet the last day Patient not taking: Reported on 08/05/2022 03/15/20   Johnn Hai, PA-C  tiZANidine (ZANAFLEX) 2 MG tablet Take 2 mg by mouth at bedtime as needed for muscle spasms.  Patient not taking: Reported on 08/05/2022 08/22/17   [provider]    Allergies as of 07/12/2022 - Review Complete 03/15/2020  Allergen Reaction Noted   Influenza virus vaccine Other (See Comments) 07/30/2014   Tramadol Other (See Comments) 01/01/2019    Family History  Problem Relation Age of Onset   Diabetes Mother    Hypertension Mother    Diabetes Father    Cirrhosis Father    Leukemia Father    Diabetes Sister    Stroke Sister    Chronic Renal Failure Sister    Breast cancer Neg Hx     Social History   Socioeconomic History   Marital status: Widowed    Spouse name: Jeneen Rinks   Number of children: 2   Years of education: Not on file   Highest education level: Not on file  Occupational History   Occupation: little bit of everything!!    Comment: retired  Tobacco Use   Smoking status: Every Day    Packs/day: 0.50    Years: 15.00    Total pack years: 7.50    Types: Cigarettes   Smokeless tobacco: Never   Tobacco comments:    future plan  Vaping Use   Vaping Use: Never used  Substance and Sexual Activity   Alcohol use: Yes    Alcohol/week: 1.0 standard drink of alcohol    Types: 1 Glasses of wine per week    Comment: very little   Drug use: No   Sexual activity: Never  Other Topics Concern   Not on file  Social History Narrative   Not on file   Social Determinants of Health   Financial Resource Strain: Not on file  Food Insecurity: Not on file  Transportation Needs: Not on file  Physical Activity: Not on file  Stress: Not on file  Social Connections: Not on file  Intimate Partner Violence: Not on file    Review of Systems: See HPI, otherwise negative ROS  Physical Exam: BP (!) 168/72   Pulse 64   Temp 97.7 F (36.5 C) (Temporal)   Resp 11   Ht 5\' 1"  (1.549 m)   Wt 95.7 kg   SpO2 95%   BMI 39.87 kg/m  General:   Alert,  cooperative in NAD Head:  Normocephalic and atraumatic. Respiratory:  Normal work of breathing. Cardiovascular:  RRR  Impression/Plan: Annette Wells is here for cataract surgery.  Risks, benefits, limitations, and alternatives regarding cataract surgery have been reviewed with the patient.  Questions have been answered.  All parties agreeable.   Benay Pillow, MD  08/29/2022, 7:11 AM

## 2022-08-31 ENCOUNTER — Encounter: Payer: Self-pay | Admitting: Ophthalmology

## 2023-01-10 ENCOUNTER — Encounter: Payer: Self-pay | Admitting: Internal Medicine

## 2023-01-11 ENCOUNTER — Ambulatory Visit: Payer: Medicare Other | Admitting: Anesthesiology

## 2023-01-11 ENCOUNTER — Encounter
Admission: RE | Disposition: A | Discharge status: Home / Self Care | Payer: Self-pay | Source: Home / Self Care | Attending: Internal Medicine

## 2023-01-11 ENCOUNTER — Ambulatory Visit
Admission: RE | Admit: 2023-01-11 | Discharge: 2023-01-11 | Disposition: A | Discharge status: Home / Self Care | Payer: Medicare Other | Attending: Internal Medicine | Admitting: Internal Medicine

## 2023-01-11 DIAGNOSIS — M1990 Unspecified osteoarthritis, unspecified site: Secondary | ICD-10-CM | POA: Insufficient documentation

## 2023-01-11 DIAGNOSIS — R0609 Other forms of dyspnea: Secondary | ICD-10-CM | POA: Diagnosis not present

## 2023-01-11 DIAGNOSIS — E7800 Pure hypercholesterolemia, unspecified: Secondary | ICD-10-CM | POA: Diagnosis not present

## 2023-01-11 DIAGNOSIS — K5730 Diverticulosis of large intestine without perforation or abscess without bleeding: Secondary | ICD-10-CM | POA: Diagnosis not present

## 2023-01-11 DIAGNOSIS — I10 Essential (primary) hypertension: Secondary | ICD-10-CM | POA: Insufficient documentation

## 2023-01-11 DIAGNOSIS — Z6841 Body Mass Index (BMI) 40.0 and over, adult: Secondary | ICD-10-CM | POA: Insufficient documentation

## 2023-01-11 DIAGNOSIS — E7439 Other disorders of intestinal carbohydrate absorption: Secondary | ICD-10-CM | POA: Insufficient documentation

## 2023-01-11 DIAGNOSIS — R7303 Prediabetes: Secondary | ICD-10-CM | POA: Insufficient documentation

## 2023-01-11 DIAGNOSIS — J449 Chronic obstructive pulmonary disease, unspecified: Secondary | ICD-10-CM | POA: Diagnosis not present

## 2023-01-11 DIAGNOSIS — F17200 Nicotine dependence, unspecified, uncomplicated: Secondary | ICD-10-CM | POA: Diagnosis not present

## 2023-01-11 DIAGNOSIS — Z83719 Family history of colon polyps, unspecified: Secondary | ICD-10-CM | POA: Diagnosis not present

## 2023-01-11 DIAGNOSIS — Z1211 Encounter for screening for malignant neoplasm of colon: Secondary | ICD-10-CM | POA: Diagnosis not present

## 2023-01-11 DIAGNOSIS — K219 Gastro-esophageal reflux disease without esophagitis: Secondary | ICD-10-CM | POA: Diagnosis not present

## 2023-01-11 HISTORY — PX: COLONOSCOPY: SHX5424

## 2023-01-11 SURGERY — COLONOSCOPY
Anesthesia: General

## 2023-01-11 MED ORDER — SODIUM CHLORIDE 0.9 % IV SOLN
INTRAVENOUS | Status: DC
  Administered 2023-01-11: 20 mL/h via INTRAVENOUS

## 2023-01-11 MED ORDER — PROPOFOL 10 MG/ML IV BOLUS
INTRAVENOUS | Status: DC | PRN
  Administered 2023-01-11 (×2): 50 mg via INTRAVENOUS

## 2023-01-11 MED ORDER — PROPOFOL 10 MG/ML IV BOLUS
INTRAVENOUS | Status: AC
  Filled 2023-01-11: qty 40

## 2023-01-11 MED ORDER — LIDOCAINE HCL (CARDIAC) PF 100 MG/5ML IV SOSY
PREFILLED_SYRINGE | INTRAVENOUS | Status: DC | PRN
  Administered 2023-01-11: 40 mg via INTRAVENOUS

## 2023-01-11 MED ORDER — PROPOFOL 500 MG/50ML IV EMUL
INTRAVENOUS | Status: DC | PRN
  Administered 2023-01-11: 90 ug/kg/min via INTRAVENOUS

## 2023-01-11 MED ORDER — LIDOCAINE HCL (PF) 2 % IJ SOLN
INTRAMUSCULAR | Status: AC
  Filled 2023-01-11: qty 5

## 2023-01-11 MED ORDER — SODIUM CHLORIDE 0.9 % IV SOLN: INTRAVENOUS | Status: DC | PRN

## 2023-01-11 NOTE — H&P (Signed)
Outpatient short stay form Pre-procedure 01/11/2023 10:57 AM Annette Wells K. Alice Reichert, M.D.  Primary Physician: Thornton Dales, NP  Reason for visit:  Family history of colon polyps  History of present illness:   77 year old patient presenting for family history of colon polyps. Patient denies any change in bowel habits, rectal bleeding or involuntary weight loss.     Current Facility-Administered Medications:    0.9 %  sodium chloride infusion, , Intravenous, Continuous, Gittel Mccamish, Benay Pike, MD, Last Rate: 20 mL/hr at 01/11/23 1025, 20 mL/hr at 01/11/23 1025  Medications Prior to Admission  Medication Sig Dispense Refill Last Dose   acetaminophen (TYLENOL) 325 MG tablet Take 650 mg by mouth every 6 (six) hours as needed for moderate pain.   01/10/2023   atorvastatin (LIPITOR) 40 MG tablet Take 1 tablet (40 mg total) by mouth daily at 6 PM. 30 tablet 0 01/10/2023   calcium-vitamin D (OSCAL WITH D) 500-200 MG-UNIT tablet Take 1 tablet by mouth 2 (two) times daily.   01/10/2023   clopidogrel (PLAVIX) 75 MG tablet Take 75 mg by mouth daily.   Past Week   diltiazem (TIAZAC) 360 MG 24 hr capsule Take 360 mg by mouth daily.   01/10/2023   ECHINACEA COMPLEX PO Take 1 tablet by mouth daily.   01/10/2023   ELDERBERRY PO Take 1 tablet by mouth daily.   01/10/2023   gabapentin (NEURONTIN) 100 MG capsule Take 100 mg by mouth 3 (three) times daily.   01/10/2023   hydrochlorothiazide (HYDRODIURIL) 25 MG tablet Take 25 mg by mouth daily.   01/11/2023 at 0630   lactobacillus acidophilus (BACID) TABS tablet Take 1 tablet by mouth daily.   01/10/2023   Magnesium 250 MG TABS Take 250 mg by mouth daily.   01/10/2023   Multiple Vitamin (MULTIVITAMIN WITH MINERALS) TABS tablet Take 1 tablet by mouth daily.   01/10/2023   Omega-3 Fatty Acids (OMEGA III EPA+DHA PO) Take 1 capsule by mouth daily.   01/10/2023   telmisartan (MICARDIS) 80 MG tablet Take 80 mg by mouth daily.   01/11/2023 at 0630   vitamin E 400 UNIT capsule Take 400 Units by mouth  daily.   01/10/2023   aspirin 81 MG tablet Take 81 mg by mouth daily.  (Patient not taking: Reported on 08/05/2022)      HYDROcodone-acetaminophen (NORCO/VICODIN) 5-325 MG tablet Take 1 tablet by mouth every 6 (six) hours as needed for moderate pain. (Patient not taking: Reported on 08/05/2022) 15 tablet 0    losartan (COZAAR) 100 MG tablet Take 100 mg by mouth daily. (Patient not taking: Reported on 08/05/2022)      Potassium 99 MG TABS Take 99 mg by mouth daily. (Patient not taking: Reported on 08/05/2022)      predniSONE (DELTASONE) 10 MG tablet Take 6 tablets  today, on day 2 take 5 tablets, day 3 take 4 tablets, day 4 take 3 tablets, day 5 take  2 tablets and 1 tablet the last day (Patient not taking: Reported on 08/05/2022) 21 tablet 0    tiZANidine (ZANAFLEX) 2 MG tablet Take 2 mg by mouth at bedtime as needed for muscle spasms.  (Patient not taking: Reported on 08/05/2022)        Allergies  Allergen Reactions   Influenza Virus Vaccine Other (See Comments)    Numbness on right side (side of injection) Paralyzed entire side for about 30 mins shortly after receiving vaccine   Tramadol Other (See Comments)    Makes her feel  out of her senses. She did not tolerate it     Past Medical History:  Diagnosis Date   Arthritis    DDD (degenerative disc disease), lumbar    GERD (gastroesophageal reflux disease)    Glucose intolerance (impaired glucose tolerance)    prediabetic. watching her diet   High cholesterol    Hypertension    Osteomyelitis (HCC)    Osteopenia    Stroke ([place]) 2018   TIA.  has been on plavix since then    Review of systems:  Otherwise negative.    Physical Exam  Gen: Alert, oriented. Appears stated age.  HEENT: [place]/AT. PERRLA. Lungs: CTA, no wheezes. CV: RR nl S1, S2. Abd: soft, benign, no masses. BS+ Ext: No edema. Pulses 2+    Planned procedures: Proceed with colonoscopy. The patient understands the nature of the planned procedure, indications, risks, alternatives  and potential complications including but not limited to bleeding, infection, perforation, damage to internal organs and possible oversedation/side effects from anesthesia. The patient agrees and gives consent to proceed.  Please refer to procedure notes for findings, recommendations and patient disposition/instructions.     Annette Wells K. Alice Reichert, M.D. Gastroenterology 01/11/2023  10:57 AM

## 2023-01-11 NOTE — Anesthesia Preprocedure Evaluation (Signed)
Anesthesia Evaluation  Patient identified by MRN, date of birth, ID band Patient awake    Reviewed: Allergy & Precautions, NPO status , Patient's Chart, lab work & pertinent test results  Airway Mallampati: III  TM Distance: >3 FB Neck ROM: full    Dental  (+) Partial Lower, Partial Upper   Pulmonary neg pulmonary ROS, COPD, Current Smoker and Patient abstained from smoking.   Pulmonary exam normal  + decreased breath sounds      Cardiovascular Exercise Tolerance: Poor hypertension, Pt. on medications + DOE  negative cardio ROS Normal cardiovascular exam Rhythm:Regular     Neuro/Psych TIAnegative neurological ROS  negative psych ROS   GI/Hepatic negative GI ROS, Neg liver ROS,GERD  Medicated,,  Endo/Other  negative endocrine ROS  Morbid obesity  Renal/GU negative Renal ROS  negative genitourinary   Musculoskeletal   Abdominal  (+) + obese  Peds negative pediatric ROS (+)  Hematology negative hematology ROS (+)   Anesthesia Other Findings Past Medical History: No date: Arthritis No date: DDD (degenerative disc disease), lumbar No date: GERD (gastroesophageal reflux disease) No date: Glucose intolerance (impaired glucose tolerance)     Comment:  prediabetic. watching her diet No date: High cholesterol No date: Hypertension No date: Osteomyelitis ([place]) No date: Osteopenia 2018: Stroke ([place])     Comment:  TIA.  has been on plavix since then  Past Surgical History: No date: APPENDECTOMY 01/09/2019: [place]; Left     Comment:  Procedure: CARPAL TUNNEL RELEASE;  Surgeon: Leanor Kail, MD;  Location: [facility] ORS;  Service: Orthopedics;                Laterality: Left; 08/15/2022: CATARACT EXTRACTION W/PHACO; Right     Comment:  Procedure: CATARACT EXTRACTION PHACO AND INTRAOCULAR               LENS PLACEMENT (IOC) RIGHT DIABETIC 2.47 00:25.9;                Surgeon: Eulogio Bear, MD;  Location: [place];  Service: Ophthalmology;  Laterality:               Right; 08/29/2022: CATARACT EXTRACTION W/PHACO; Left     Comment:  Procedure: CATARACT EXTRACTION PHACO AND INTRAOCULAR               LENS PLACEMENT (IOC) LEFT DIABETIC 2.65 00:20.7;                Surgeon: Eulogio Bear, MD;  Location: [place];  Service: Ophthalmology;  Laterality: Left; No date: COLONOSCOPY WITH ESOPHAGOGASTRODUODENOSCOPY (EGD) 11/11/2016: COLONOSCOPY WITH PROPOFOL; N/A     Comment:  Procedure: COLONOSCOPY WITH PROPOFOL;  Surgeon: Manya Silvas, MD;  Location: [facility] ENDOSCOPY;  Service:               Endoscopy;  Laterality: N/A; No date: mandibular osteomyelitis No date: TUBAL LIGATION  BMI    Body Mass Index: 40.96 kg/m      Reproductive/Obstetrics negative OB ROS  Anesthesia Physical Anesthesia Plan  ASA: 3  Anesthesia Plan: General   Post-op Pain Management:    Induction: Intravenous  PONV Risk Score and Plan: Propofol infusion and TIVA  Airway Management Planned: Natural Airway and Nasal Cannula  Additional Equipment:   Intra-op Plan:   Post-operative Plan:   Informed Consent: I have reviewed the patients History and Physical, chart, labs and discussed the procedure including the risks, benefits and alternatives for the proposed anesthesia with the patient or authorized representative who has indicated his/her understanding and acceptance.     Dental Advisory Given  Plan Discussed with: CRNA  Anesthesia Plan Comments:        Anesthesia Quick Evaluation

## 2023-01-11 NOTE — Transfer of Care (Signed)
Immediate Anesthesia Transfer of Care Note  Patient: [name]  Procedure(s) Performed: COLONOSCOPY  Patient Location: PACU  Anesthesia Type:General  Level of Consciousness: drowsy and patient cooperative  Airway & Oxygen Therapy: Patient Spontanous Breathing and Patient connected to nasal cannula oxygen  Post-op Assessment: Report given to RN, Post -op Vital signs reviewed and stable, and Patient moving all extremities  Post vital signs: Reviewed and stable  Last Vitals:  Vitals Value Taken Time  BP    Temp    Pulse 58 01/11/23 1131  Resp 12 01/11/23 1131  SpO2 100 % 01/11/23 1131  Vitals shown include unvalidated device data.  Last Pain:  Vitals:   01/11/23 1003  TempSrc: Temporal  PainSc: 0-No pain         Complications: No notable events documented.

## 2023-01-11 NOTE — Interval H&P Note (Signed)
History and Physical Interval Note:  01/11/2023 10:59 AM  Annette Wells  has presented today for surgery, with the diagnosis of Family history of polyps in the colon (Z83.719) Colon cancer screening (Z12.11).  The various methods of treatment have been discussed with the patient and family. After consideration of risks, benefits and other options for treatment, the patient has consented to  Procedure(s): COLONOSCOPY (N/A) as a surgical intervention.  The patient's history has been reviewed, patient examined, no change in status, stable for surgery.  I have reviewed the patient's chart and labs.  Questions were answered to the patient's satisfaction.     [place], [place]

## 2023-01-11 NOTE — Anesthesia Postprocedure Evaluation (Signed)
Anesthesia Post Note  Patient: Annette Wells  Procedure(s) Performed: COLONOSCOPY  Patient location during evaluation: PACU Anesthesia Type: General Level of consciousness: awake and awake and alert Pain management: satisfactory to patient Respiratory status: spontaneous breathing and respiratory function stable Cardiovascular status: stable Anesthetic complications: no  No notable events documented.   Last Vitals:  Vitals:   01/11/23 1141 01/11/23 1151  BP: 115/75 (!) 141/85  Pulse: (!) 58 (!) 52  Resp: 19 15  Temp:    SpO2: 100% 100%    Last Pain:  Vitals:   01/11/23 1151  TempSrc:   PainSc: 0-No pain                 VAN STAVEREN,[name]

## 2023-01-11 NOTE — Op Note (Signed)
[facility] Gastroenterology Patient Name: Annette Wells Procedure Date: 01/11/2023 10:55 AM MRN: [id number] Account #: [id number] Date of Birth: 09-05-1946 Admit Type: Outpatient Age: 77 Room: [facility] ENDO ROOM 2 Gender: Female Note Status: Finalized Instrument Name: Nelda Marseille [id number] Procedure:             Colonoscopy Indications:           Colon cancer screening in patient at increased risk:                         Family history of 1st-degree relative with colon polyps Providers:             Boykin Nearing. Loula Marcella MD, MD Medicines:             Propofol per Anesthesia Complications:         No immediate complications. Procedure:             Pre-Anesthesia Assessment:                        - The risks and benefits of the procedure and the                         sedation options and risks were discussed with the                         patient. All questions were answered and informed                         consent was obtained.                        - Patient identification and proposed procedure were                         verified prior to the procedure by the nurse. The                         procedure was verified in the procedure room.                        - ASA Grade Assessment: III - A patient with severe                         systemic disease.                        - After reviewing the risks and benefits, the patient                         was deemed in satisfactory condition to undergo the                         procedure.                        After obtaining informed consent, the colonoscope was                         passed under direct vision. Throughout the procedure,  the patient's blood pressure, pulse, and oxygen                         saturations were monitored continuously. The                         Colonoscope was introduced through the anus and                         advanced to the the cecum, identified by  appendiceal                         orifice and ileocecal valve. The colonoscopy was                         performed without difficulty. The patient tolerated                         the procedure well. The quality of the bowel                         preparation was adequate. The appendiceal orifice and                         Crow's foot were photographed. Findings:      The perianal and digital rectal examinations were normal. Pertinent       negatives include normal sphincter tone and no palpable rectal lesions.      Multiple large-mouthed and medium-mouthed diverticula were found in the       entire colon. There was no evidence of diverticular bleeding.      The exam was otherwise without abnormality on direct and retroflexion       views. Impression:            - Moderate diverticulosis in the entire examined                         colon. There was no evidence of diverticular bleeding.                        - The examination was otherwise normal on direct and                         retroflexion views.                        - No specimens collected. Recommendation:        - Patient has a contact number available for                         emergencies. The signs and symptoms of potential                         delayed complications were discussed with the patient.                         Return to normal activities tomorrow. Written  discharge instructions were provided to the patient.                        - Resume previous diet.                        - Continue present medications.                        - No repeat colonoscopy due to current age (68 years                         or older) and the absence of colonic polyps.                        - You do NOT require further colon cancer screening                         measures (Annual stool testing (i.e. hemoccult, FIT,                         cologuard), sigmoidoscopy, colonoscopy or CT                          colonography). You should share this recommendation                         with your Primary Care provider.                        - Return to GI office PRN.                        - The findings and recommendations were discussed with                         the patient.                        - Resume Plavix (clopidogrel) at prior dose today.                         Refer to managing physician for further adjustment of                         therapy. Procedure Code(s):     --- Professional ---                        V3710, Colorectal cancer screening; colonoscopy on                         individual at high risk Diagnosis Code(s):     --- Professional ---                        K57.30, Diverticulosis of large intestine without                         perforation or abscess without bleeding  Z83.71, Family history of colonic polyps CPT copyright 2022 American [employer] Association. All rights reserved. The codes documented in this report are preliminary and upon coder review may  be revised to meet current compliance requirements. Efrain Sella MD, MD 01/11/2023 11:31:25 AM This report has been signed electronically. Number of Addenda: 0 Note Initiated On: 01/11/2023 10:55 AM Scope Withdrawal Time: 0 hours 9 minutes 21 seconds  Total Procedure Duration: 0 hours 16 minutes 57 seconds  Estimated Blood Loss:  Estimated blood loss: none.      [facility]

## 2023-01-12 ENCOUNTER — Encounter: Payer: Self-pay | Admitting: Internal Medicine

## 2023-03-14 ENCOUNTER — Other Ambulatory Visit: Payer: Self-pay | Admitting: Gerontology

## 2023-03-14 DIAGNOSIS — Z1231 Encounter for screening mammogram for malignant neoplasm of breast: Secondary | ICD-10-CM

## 2023-03-28 ENCOUNTER — Ambulatory Visit
Admission: RE | Admit: 2023-03-28 | Discharge: 2023-03-28 | Disposition: A | Discharge status: Home / Self Care | Payer: Medicare Other | Source: Ambulatory Visit | Attending: Gerontology | Admitting: Gerontology

## 2023-03-28 DIAGNOSIS — Z1231 Encounter for screening mammogram for malignant neoplasm of breast: Secondary | ICD-10-CM

## 2023-06-28 ENCOUNTER — Ambulatory Visit (INDEPENDENT_AMBULATORY_CARE_PROVIDER_SITE_OTHER): Payer: Medicare Other

## 2023-06-28 ENCOUNTER — Ambulatory Visit
Admission: EM | Admit: 2023-06-28 | Discharge: 2023-06-28 | Disposition: A | Discharge status: Home / Self Care | Payer: Medicare Other | Attending: Family Medicine | Admitting: Family Medicine

## 2023-06-28 DIAGNOSIS — M79671 Pain in right foot: Secondary | ICD-10-CM | POA: Diagnosis not present

## 2023-06-28 DIAGNOSIS — M79674 Pain in right toe(s): Secondary | ICD-10-CM

## 2023-06-28 DIAGNOSIS — I10 Essential (primary) hypertension: Secondary | ICD-10-CM

## 2023-06-28 DIAGNOSIS — S92501A Displaced unspecified fracture of right lesser toe(s), initial encounter for closed fracture: Secondary | ICD-10-CM | POA: Diagnosis not present

## 2023-06-28 MED ORDER — ASPIRIN 81 MG PO CHEW: 324.0000 mg | CHEWABLE_TABLET | Freq: Once | ORAL | Status: DC

## 2023-06-28 NOTE — ED Triage Notes (Signed)
Pt c/o R pinky toe pain x1 day. States she ran into the treadmill. Has tried tylenol w/o relief.

## 2023-06-28 NOTE — ED Provider Notes (Signed)
[facility]    CSN: [id number] Arrival date & time: 06/28/23  1255      History   Chief Complaint Chief Complaint  Patient presents with   Toe Injury    HPI  HPI Annette Wells is a 77 y.o. female.   Annette Wells presents for right toe pain after she walked into a treadmill by mistake. She had immediate pain but did not hear any abnormal pops or sounds.  She does note that her toe was off to the side and she had to straighten it back up.  She put some buddy tape around it.  She has been taking Tylenol with some relief of her pain.  Has never injured this foot before.      Past Medical History:  Diagnosis Date   Arthritis    DDD (degenerative disc disease), lumbar    GERD (gastroesophageal reflux disease)    Glucose intolerance (impaired glucose tolerance)    prediabetic. watching her diet   High cholesterol    Hypertension    Osteomyelitis (HCC)    Osteopenia    Stroke (HCC) 2018   TIA.  has been on plavix since then    Patient Active Problem List   Diagnosis Date Noted   TIA (transient ischemic attack) 08/23/2017    Past Surgical History:  Procedure Laterality Date   APPENDECTOMY     CARPAL TUNNEL RELEASE Left 01/09/2019   Procedure: CARPAL TUNNEL RELEASE;  Surgeon: Erin Sons, MD;  Location: [facility] ORS;  Service: Orthopedics;  Laterality: Left;   CATARACT EXTRACTION W/PHACO Right 08/15/2022   Procedure: CATARACT EXTRACTION PHACO AND INTRAOCULAR LENS PLACEMENT (IOC) RIGHT DIABETIC 2.47 00:25.9;  Surgeon: Nevada Crane, MD;  Location: [facility];  Service: Ophthalmology;  Laterality: Right;   CATARACT EXTRACTION W/PHACO Left 08/29/2022   Procedure: CATARACT EXTRACTION PHACO AND INTRAOCULAR LENS PLACEMENT (IOC) LEFT DIABETIC 2.65 00:20.7;  Surgeon: Nevada Crane, MD;  Location: [facility];  Service: Ophthalmology;  Laterality: Left;   COLONOSCOPY N/A 01/11/2023   Procedure: COLONOSCOPY;  Surgeon: Toledo, Boykin Nearing, MD;  Location:  [facility] ENDOSCOPY;  Service: Gastroenterology;  Laterality: N/A;   COLONOSCOPY WITH ESOPHAGOGASTRODUODENOSCOPY (EGD)     COLONOSCOPY WITH PROPOFOL N/A 11/11/2016   Procedure: COLONOSCOPY WITH PROPOFOL;  Surgeon: Scot Jun, MD;  Location: [facility] ENDOSCOPY;  Service: Endoscopy;  Laterality: N/A;   mandibular osteomyelitis     TUBAL LIGATION      OB History   No obstetric history on file.      Home Medications    Prior to Admission medications   Medication Sig Start Date End Date Taking? Authorizing Provider  acetaminophen (TYLENOL) 325 MG tablet Take 650 mg by mouth every 6 (six) hours as needed for moderate pain.   Yes [provider]  atorvastatin (LIPITOR) 40 MG tablet Take 1 tablet (40 mg total) by mouth daily at 6 PM. 08/24/17  Yes Delfino Lovett, MD  calcium-vitamin D (OSCAL WITH D) 500-200 MG-UNIT tablet Take 1 tablet by mouth 2 (two) times daily.   Yes [provider]  cloNIDine (CATAPRES) 0.1 MG tablet Take 0.1 mg by mouth 2 (two) times daily.   Yes [provider]  clopidogrel (PLAVIX) 75 MG tablet Take 75 mg by mouth daily.   Yes [provider]  diltiazem (TIAZAC) 360 MG 24 hr capsule Take 360 mg by mouth daily. 08/14/17  Yes [provider]  ECHINACEA COMPLEX PO Take 1 tablet by mouth daily.   Yes  [provider]  ELDERBERRY PO Take 1 tablet by mouth daily.   Yes [provider]  gabapentin (NEURONTIN) 100 MG capsule Take 100 mg by mouth 3 (three) times daily.   Yes [provider]  hydrochlorothiazide (HYDRODIURIL) 25 MG tablet Take 25 mg by mouth daily.   Yes [provider]  lactobacillus acidophilus (BACID) TABS tablet Take 1 tablet by mouth daily.   Yes [provider]  Magnesium 250 MG TABS Take 250 mg by mouth daily.   Yes [provider]  Multiple Vitamin (MULTIVITAMIN WITH MINERALS) TABS tablet Take 1 tablet by mouth daily.   Yes [provider]  Omega-3 Fatty  Acids (OMEGA III EPA+DHA PO) Take 1 capsule by mouth daily.   Yes [provider]  telmisartan (MICARDIS) 80 MG tablet Take 80 mg by mouth daily.   Yes [provider]  vitamin E 400 UNIT capsule Take 400 Units by mouth daily.   Yes [provider]  aspirin 81 MG tablet Take 81 mg by mouth daily.  Patient not taking: Reported on 08/05/2022    [provider]  HYDROcodone-acetaminophen (NORCO/VICODIN) 5-325 MG tablet Take 1 tablet by mouth every 6 (six) hours as needed for moderate pain. Patient not taking: Reported on 08/05/2022 03/15/20   Tommi Rumps, PA-C  losartan (COZAAR) 100 MG tablet Take 100 mg by mouth daily. Patient not taking: Reported on 08/05/2022    [provider]  Potassium 99 MG TABS Take 99 mg by mouth daily. Patient not taking: Reported on 08/05/2022    [provider]  predniSONE (DELTASONE) 10 MG tablet Take 6 tablets  today, on day 2 take 5 tablets, day 3 take 4 tablets, day 4 take 3 tablets, day 5 take  2 tablets and 1 tablet the last day Patient not taking: Reported on 08/05/2022 03/15/20   Tommi Rumps, PA-C  tiZANidine (ZANAFLEX) 2 MG tablet Take 2 mg by mouth at bedtime as needed for muscle spasms.  Patient not taking: Reported on 08/05/2022 08/22/17   [provider]    Family History Family History  Problem Relation Age of Onset   Diabetes Mother    Hypertension Mother    Diabetes Father    Cirrhosis Father    Leukemia Father    Diabetes Sister    Stroke Sister    Chronic Renal Failure Sister    Breast cancer Neg Hx     Social History Social History   Tobacco Use   Smoking status: Every Day    Current packs/day: 0.50    Average packs/day: 0.5 packs/day for 15.0 years (7.5 ttl pk-yrs)    Types: Cigarettes   Smokeless tobacco: Never   Tobacco comments:    future plan  Vaping Use   Vaping status: Never Used  Substance Use Topics   Alcohol use: Yes    Alcohol/week: 1.0 standard drink of  alcohol    Types: 1 Glasses of wine per week    Comment: very little   Drug use: No     Allergies   Influenza virus vaccine and Tramadol   Review of Systems Review of Systems: :negative unless otherwise stated in HPI.      Physical Exam Triage Vital Signs ED Triage Vitals  Encounter Vitals Group     BP 06/28/23 1302 (!) 194/85     Systolic BP Percentile --      Diastolic BP Percentile --      Pulse Rate 06/28/23 1302  89     Resp 06/28/23 1302 16     Temp 06/28/23 1302 98.4 F (36.9 C)     Temp Source 06/28/23 1302 Oral     SpO2 06/28/23 1302 94 %     Weight 06/28/23 1301 219 lb (99.3 kg)     Height 06/28/23 1301 5\' 1"  (1.549 m)     Head Circumference --      Peak Flow --      Pain Score 06/28/23 1304 8     Pain Loc --      Pain Education --      Exclude from Growth Chart --    No data found.  Updated Vital Signs BP (!) 162/86 (BP Location: Left Arm)   Pulse 89   Temp 98.4 F (36.9 C) (Oral)   Resp 16   Ht 5\' 1"  (1.549 m)   Wt 99.3 kg   SpO2 94%   BMI 41.38 kg/m   Visual Acuity Right Eye Distance:   Left Eye Distance:   Bilateral Distance:    Right Eye Near:   Left Eye Near:    Bilateral Near:     Physical Exam GEN: well appearing female in no acute distress  CVS: well perfused  RESP: speaking in full sentences without pause, no respiratory distress  MSK:  Foot, Right: TTP noted at the proximal 5th toe with ecchymosis and edema. No visible bony deformity. Transverse arch grossly intact;  No evidence of tibiotalar deviation; Range of motion is full in all directions. Strength is 5/5 in all directions. No tenderness at the insertion/body/myotendinous junction of the Achilles tendon; No tenderness on posterior aspects of lateral and medial malleolus; Unremarkable squeeze; Talar dome non-tender; Unremarkable calcaneal squeeze; No plantar calcaneal tenderness; No tenderness over the navicular prominence or  over cuboid; No pain at base of 5th MT;  Able to  walk 4 steps.     UC Treatments / Results  Labs (all labs ordered are listed, but only abnormal results are displayed) Labs Reviewed - No data to display  EKG   Radiology DG Foot Complete Right  Result Date: 06/28/2023 CLINICAL DATA:  Right foot pain EXAM: RIGHT FOOT COMPLETE - 3+ VIEW COMPARISON:  None Available. FINDINGS: Acute nondisplaced extra-articular fracture involving the proximal phalanx of the fifth toe. No additional fractures. No dislocation. Moderate osteoarthritis of the first MTP joint. Generalized soft tissue swelling. IMPRESSION: Acute nondisplaced fracture of the fifth toe proximal phalanx. Electronically Signed   By: Duanne Guess D.O.   On: 06/28/2023 14:28     Procedures Procedures (including critical care time)  Medications Ordered in UC Medications - No data to display  Initial Impression / Assessment and Plan / UC Course  I have reviewed the triage vital signs and the nursing notes.  Pertinent labs & imaging results that were available during my care of the patient were reviewed by me and considered in my medical decision making (see chart for details).      Pt is a 77 y.o.  female with 1 day of right great toe pain after walking into a treadmill.   On exam, pt has tenderness at proximal 5th metatarsal concerning for fracture.   Obtained 5th great toe plain films.  Personally interpreted by me were remarkable for possibly displaced fracture of 5th toe. Radiologist report reviewed and additionally notes non-displaced proximal phalanx fracture of the right 5th toe and moderate OA of the 1st MTP joint. Has some generalized soft tissue swelling.  Given hard soled shoe prior to discharge.  Pt declined additional pain control here and at home.   Patient to gradually return to normal activities, as tolerated and continue ordinary activities within the limits permitted by pain. Continue Tylenol PRN. Patient to follow up with podiatrist for definitive treatment  and fracture management.    Annette Wells is hypertensive here.  BP 194/85 then after sitting was 162/86. I suspect high blood pressure issue is secondary to pain. Takes Clonidine, Diltiazem, HCTZ, and an ACEi (chart notes telmsartan but also losartan). Denies needing refills. Recommended she check herblood pressure and follow up with her primary care provider in the next 2 weeks.   Return and ED precautions given. Understanding voiced. Discussed MDM, treatment plan and plan for follow-up with patient and her daughter who agrees with plan.   Final Clinical Impressions(s) / UC Diagnoses   Final diagnoses:  Closed fracture of phalanx of right fifth toe, initial encounter  Pain of toe of right foot  Elevated blood pressure reading with diagnosis of hypertension     Discharge Instructions      You have broken your foot. Follow up with podiatrist Dr Sharl Ma or Dr Gwyneth Revels or your own podiatrist of choice.    Wear your post-op shoe with activity.  If medication was prescribed, stop by the pharmacy to pick up your prescriptions.  For your  pain, Take 1000 mg Tylenol 2-3 times a day as needed for pain. Rest and elevate the affected painful area.  Apply cold compresses intermittently, as needed.  As pain recedes, begin normal activities slowly as tolerated.  Follow up with primary care provider or an orthopedic provider, if symptoms persist.  Watch for worsening symptoms such as an increasing weakness or loss of sensation, increasing pain and/or the loss of bladder or bowel function. Should any of these occur, go to the emergency department immediately.        ED Prescriptions   None    PDMP not reviewed this encounter.   Katha Cabal, DO 07/01/23 [phone]

## 2023-06-28 NOTE — Discharge Instructions (Addendum)
You have broken your foot. Follow up with podiatrist Dr Sharl Ma or Dr Gwyneth Revels or your own podiatrist of choice.    Wear your post-op shoe with activity.  If medication was prescribed, stop by the pharmacy to pick up your prescriptions.  For your  pain, Take 1000 mg Tylenol 2-3 times a day as needed for pain. Rest and elevate the affected painful area.  Apply cold compresses intermittently, as needed.  As pain recedes, begin normal activities slowly as tolerated.  Follow up with primary care provider or an orthopedic provider, if symptoms persist.  Watch for worsening symptoms such as an increasing weakness or loss of sensation, increasing pain and/or the loss of bladder or bowel function. Should any of these occur, go to the emergency department immediately.

## 2023-07-12 ENCOUNTER — Ambulatory Visit: Payer: Medicare Other | Admitting: Podiatry

## 2023-07-17 ENCOUNTER — Other Ambulatory Visit: Payer: Self-pay | Admitting: Physical Medicine & Rehabilitation

## 2023-07-17 DIAGNOSIS — G8929 Other chronic pain: Secondary | ICD-10-CM

## 2023-08-03 ENCOUNTER — Ambulatory Visit
Admission: RE | Admit: 2023-08-03 | Discharge: 2023-08-03 | Disposition: A | Discharge status: Home / Self Care | Payer: Medicare Other | Source: Ambulatory Visit | Attending: Physical Medicine & Rehabilitation | Admitting: Physical Medicine & Rehabilitation

## 2023-08-03 DIAGNOSIS — G8929 Other chronic pain: Secondary | ICD-10-CM

## 2023-08-21 ENCOUNTER — Other Ambulatory Visit: Payer: Self-pay | Admitting: Physical Medicine & Rehabilitation

## 2023-08-21 DIAGNOSIS — K828 Other specified diseases of gallbladder: Secondary | ICD-10-CM

## 2023-08-29 ENCOUNTER — Ambulatory Visit
Admission: RE | Admit: 2023-08-29 | Discharge: 2023-08-29 | Disposition: A | Discharge status: Home / Self Care | Payer: Medicare Other | Source: Ambulatory Visit | Attending: Physical Medicine & Rehabilitation | Admitting: Physical Medicine & Rehabilitation

## 2023-08-29 DIAGNOSIS — K828 Other specified diseases of gallbladder: Secondary | ICD-10-CM

## 2024-02-22 ENCOUNTER — Other Ambulatory Visit: Payer: Self-pay | Admitting: Gerontology

## 2024-02-22 DIAGNOSIS — Z1231 Encounter for screening mammogram for malignant neoplasm of breast: Secondary | ICD-10-CM

## 2024-03-28 ENCOUNTER — Ambulatory Visit
Admission: RE | Admit: 2024-03-28 | Discharge: 2024-03-28 | Disposition: A | Discharge status: Home / Self Care | Source: Ambulatory Visit | Attending: Gerontology | Admitting: Gerontology

## 2024-03-28 DIAGNOSIS — Z1231 Encounter for screening mammogram for malignant neoplasm of breast: Secondary | ICD-10-CM | POA: Diagnosis present

## 2024-08-30 ENCOUNTER — Other Ambulatory Visit: Payer: Self-pay | Admitting: Gerontology

## 2024-08-30 DIAGNOSIS — N939 Abnormal uterine and vaginal bleeding, unspecified: Secondary | ICD-10-CM

## 2024-09-02 ENCOUNTER — Ambulatory Visit
Admission: RE | Admit: 2024-09-02 | Discharge: 2024-09-02 | Disposition: A | Discharge status: Home / Self Care | Source: Ambulatory Visit | Attending: Gerontology | Admitting: Gerontology

## 2024-09-02 DIAGNOSIS — N939 Abnormal uterine and vaginal bleeding, unspecified: Secondary | ICD-10-CM | POA: Insufficient documentation
